# Patient Record
Sex: Female | Born: 1995
Health system: Southern US, Community
[De-identification: ages and names within clinical notes are randomized; demographics above are authoritative.]

## PROBLEM LIST (undated history)

## (undated) DIAGNOSIS — R42 Dizziness and giddiness: Secondary | ICD-10-CM

## (undated) DIAGNOSIS — J329 Chronic sinusitis, unspecified: Secondary | ICD-10-CM

## (undated) DIAGNOSIS — R51 Headache: Secondary | ICD-10-CM

## (undated) DIAGNOSIS — K5900 Constipation, unspecified: Secondary | ICD-10-CM

## (undated) DIAGNOSIS — R1011 Right upper quadrant pain: Secondary | ICD-10-CM

## (undated) DIAGNOSIS — Z00129 Encounter for routine child health examination without abnormal findings: Secondary | ICD-10-CM

## (undated) DIAGNOSIS — K9041 Non-celiac gluten sensitivity: Secondary | ICD-10-CM

## (undated) DIAGNOSIS — K219 Gastro-esophageal reflux disease without esophagitis: Secondary | ICD-10-CM

## (undated) DIAGNOSIS — R109 Unspecified abdominal pain: Secondary | ICD-10-CM

## (undated) DIAGNOSIS — F909 Attention-deficit hyperactivity disorder, unspecified type: Secondary | ICD-10-CM

## (undated) DIAGNOSIS — F419 Anxiety disorder, unspecified: Secondary | ICD-10-CM

## (undated) DIAGNOSIS — E663 Overweight: Secondary | ICD-10-CM

## (undated) DIAGNOSIS — T7840XA Allergy, unspecified, initial encounter: Secondary | ICD-10-CM

## (undated) DIAGNOSIS — Z0000 Encounter for general adult medical examination without abnormal findings: Secondary | ICD-10-CM

## (undated) DIAGNOSIS — F329 Major depressive disorder, single episode, unspecified: Secondary | ICD-10-CM

## (undated) DIAGNOSIS — IMO0001 Reserved for inherently not codable concepts without codable children: Secondary | ICD-10-CM

## (undated) HISTORY — DX: Right upper quadrant pain: R10.11

## (undated) HISTORY — DX: Non-celiac gluten sensitivity: K90.41

## (undated) HISTORY — DX: Anxiety disorder, unspecified: F41.9

## (undated) HISTORY — DX: Encounter for general adult medical examination without abnormal findings: Z00.00

## (undated) HISTORY — DX: Gastro-esophageal reflux disease without esophagitis: K21.9

## (undated) HISTORY — DX: Unspecified abdominal pain: R10.9

## (undated) HISTORY — DX: Overweight: E66.3

## (undated) HISTORY — DX: Major depressive disorder, single episode, unspecified: F32.9

## (undated) HISTORY — PX: ADENOIDECTOMY: SUR15

## (undated) HISTORY — DX: Reserved for inherently not codable concepts without codable children: IMO0001

## (undated) HISTORY — DX: Encounter for routine child health examination without abnormal findings: Z00.129

## (undated) HISTORY — PX: SINUS EXPLORATION: SHX5214

## (undated) HISTORY — DX: Chronic sinusitis, unspecified: J32.9

## (undated) HISTORY — DX: Dizziness and giddiness: R42

## (undated) HISTORY — DX: Constipation, unspecified: K59.00

## (undated) HISTORY — PX: TONSILLECTOMY: SUR1361

---
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
## 2016-12-08 ENCOUNTER — Other Ambulatory Visit (INDEPENDENT_AMBULATORY_CARE_PROVIDER_SITE_OTHER): Payer: 59

## 2016-12-08 ENCOUNTER — Encounter: Payer: Self-pay | Admitting: Family Medicine

## 2016-12-08 ENCOUNTER — Ambulatory Visit (INDEPENDENT_AMBULATORY_CARE_PROVIDER_SITE_OTHER): Payer: 59 | Admitting: Family Medicine

## 2016-12-08 VITALS — BP 118/72 | HR 75 | Temp 98.4°F | Resp 18 | Ht 66.0 in | Wt 190.0 lb

## 2016-12-08 DIAGNOSIS — N649 Disorder of breast, unspecified: Secondary | ICD-10-CM

## 2016-12-08 DIAGNOSIS — F329 Major depressive disorder, single episode, unspecified: Secondary | ICD-10-CM | POA: Diagnosis not present

## 2016-12-08 DIAGNOSIS — R002 Palpitations: Secondary | ICD-10-CM | POA: Diagnosis not present

## 2016-12-08 DIAGNOSIS — R1011 Right upper quadrant pain: Secondary | ICD-10-CM

## 2016-12-08 DIAGNOSIS — E663 Overweight: Secondary | ICD-10-CM

## 2016-12-08 DIAGNOSIS — R42 Dizziness and giddiness: Secondary | ICD-10-CM

## 2016-12-08 DIAGNOSIS — F419 Anxiety disorder, unspecified: Secondary | ICD-10-CM | POA: Diagnosis not present

## 2016-12-08 DIAGNOSIS — Z0000 Encounter for general adult medical examination without abnormal findings: Secondary | ICD-10-CM

## 2016-12-08 DIAGNOSIS — Z0001 Encounter for general adult medical examination with abnormal findings: Secondary | ICD-10-CM | POA: Diagnosis not present

## 2016-12-08 DIAGNOSIS — F32A Depression, unspecified: Secondary | ICD-10-CM

## 2016-12-08 HISTORY — DX: Right upper quadrant pain: R10.11

## 2016-12-08 HISTORY — DX: Dizziness and giddiness: R42

## 2016-12-08 LAB — CBC WITH DIFFERENTIAL/PLATELET
Basophils Absolute: 0.1 10*3/uL (ref 0.0–0.1)
Basophils Relative: 0.9 % (ref 0.0–3.0)
Eosinophils Absolute: 0.3 10*3/uL (ref 0.0–0.7)
Eosinophils Relative: 4 % (ref 0.0–5.0)
HCT: 40 % (ref 36.0–46.0)
Hemoglobin: 13.4 g/dL (ref 12.0–15.0)
Lymphocytes Relative: 39.2 % (ref 12.0–46.0)
Lymphs Abs: 2.9 10*3/uL (ref 0.7–4.0)
MCHC: 33.4 g/dL (ref 30.0–36.0)
MCV: 84.3 fl (ref 78.0–100.0)
MONO ABS: 0.6 10*3/uL (ref 0.1–1.0)
Monocytes Relative: 7.5 % (ref 3.0–12.0)
Neutro Abs: 3.6 10*3/uL (ref 1.4–7.7)
Neutrophils Relative %: 48.4 % (ref 43.0–77.0)
Platelets: 233 10*3/uL (ref 150.0–400.0)
RBC: 4.75 Mil/uL (ref 3.87–5.11)
RDW: 13.9 % (ref 11.5–14.6)
WBC: 7.5 10*3/uL (ref 4.5–10.5)

## 2016-12-08 LAB — COMPREHENSIVE METABOLIC PANEL
ALT: 36 U/L — AB (ref 0–35)
AST: 26 U/L (ref 0–37)
Albumin: 4.8 g/dL (ref 3.5–5.2)
Alkaline Phosphatase: 70 U/L (ref 39–117)
BILIRUBIN TOTAL: 0.4 mg/dL (ref 0.2–1.2)
BUN: 15 mg/dL (ref 6–23)
CHLORIDE: 104 meq/L (ref 96–112)
CO2: 26 mEq/L (ref 19–32)
Calcium: 10 mg/dL (ref 8.4–10.5)
Creatinine, Ser: 0.85 mg/dL (ref 0.40–1.20)
GFR: 90.14 mL/min (ref >60.00)
GLUCOSE: 71 mg/dL (ref 70–99)
POTASSIUM: 3.7 meq/L (ref 3.5–5.1)
Sodium: 139 mEq/L (ref 135–145)
Total Protein: 7.8 g/dL (ref 6.0–8.3)

## 2016-12-08 LAB — TSH: TSH: 1.78 u[IU]/mL (ref 0.35–5.50)

## 2016-12-08 LAB — SEDIMENTATION RATE: SED RATE: 7 mm/h (ref 0–20)

## 2016-12-08 NOTE — Assessment & Plan Note (Signed)
Patient encouraged to maintain heart healthy diet, regular exercise, adequate sleep. Consider daily probiotics. Take medications as prescribed 

## 2016-12-08 NOTE — Progress Notes (Signed)
Pre visit review using our clinic review tool, if applicable. No additional management support is needed unless otherwise documented below in the visit note. 

## 2016-12-08 NOTE — Patient Instructions (Addendum)
64 oz of clear fluids, gatorade,small frequent meals with protein Postural Orthostatic Tachycardia Syndrome Postural orthostatic tachycardia syndrome (POTS) is a group of symptoms that can occur when you stand up after lying down. POTS happens when less blood flows to the heart than normal after you stand up. The reduced blood flow to the heart makes the heart beat rapidly. POTS may be associated with another medical condition, or it may occur on its own. What are the causes? This cause of this condition is not known, but many conditions and diseases have been linked to it. What increases the risk? This condition is more likely to develop in:  Women 13-66 years old.  Women who are pregnant.  Women who are menstruating.  People who have certain conditions, including:  A viral infection.  An autoimmune disease.  Anemia.  Dehydration.  Hyperthyroidism.  People who take certain medicines.  People who have had a major injury.  People who have had surgery. What are the signs or symptoms? The most common symptom of this condition is lightheadedness after standing up from a lying down position. Other symptoms may include:  Feeling a rapid increase in the speed of the heartbeat (tachycardia) within 10 minutes of standing up.  Fainting.  Weakness.  Confusion.  Trembling.  Shortness of breath.  Sweating or flushing.  Headache.  Chest pain.  Breathing that is deeper and faster than normal (hyperventilation).  Nausea.  Anxiety. Symptoms may be worse in the morning, and they may be relieved by lying down. How is this diagnosed? This condition is diagnosed based on:  Your symptoms.  Your medical history.  A physical exam.  Measurements of your heart rate when you are lying down and after you stand up.  A measurement of your blood pressure. The measurement will be taken when you go from lying down to standing up.  Blood tests to measure hormones that change  with blood pressure. The blood tests will be done when you are lying down and standing up. You may have other tests to check whether you have a condition or disease that is linked to POTS. How is this treated? Treatment for this condition depends on how severe your symptoms are and whether you have any conditions or diseases that have been linked to POTS. Treatment may involve:  Treating any conditions or diseases that have been linked to POTS.  Drinking two glasses of water before getting up from a lying position.  Eating more salt (sodium).  Taking medicine to control blood pressure and heart rate (beta-blocker).  Taking medicines to control blood flow, blood pressure, or heart rate.  Avoiding certain medicines.  Starting an exercise program under the supervision of your health care provider. Follow these instructions at home:   Eating and drinking   Drink enough fluid to keep your urine clear or pale yellow.  If told by your health care provider, drink two glasses of water before getting up from a lying position.  Follow instructions from your health care provider about how much sodium you should eat.  Eat several small meals a day instead of a few large meals.  Avoid heavy meals. Medicines   Take over-the-counter and prescription medicines only as told by your health care provider.  Talk with your health care provider before starting any new medicines. Activity   Do an aerobic exercise for 20 minutes a day, at least 3 days a week.  Ask your health care provider what kinds of exercise are safe for  you. Contact a health care provider if:  Your symptoms do not improve after treatment.  Your symptoms get worse.  You develop new symptoms. Get help right away if:  You have chest pain. You have difficulty breathing. Preventive Care 18-39 Years, Female Preventive care refers to lifestyle choices and visits with your health care provider that can promote health and  wellness. What does preventive care include?  A yearly physical exam. This is also called an annual well check.  Dental exams once or twice a year.  Routine eye exams. Ask your health care provider how often you should have your eyes checked.  Personal lifestyle choices, including:  Daily care of your teeth and gums.  Regular physical activity.  Eating a healthy diet.  Avoiding tobacco and drug use.  Limiting alcohol use.  Practicing safe sex.  Taking vitamin and mineral supplements as recommended by your health care provider. What happens during an annual well check? The services and screenings done by your health care provider during your annual well check will depend on your age, overall health, lifestyle risk factors, and family history of disease. Counseling  Your health care provider may ask you questions about your:  Alcohol use.  Tobacco use.  Drug use.  Emotional well-being.  Home and relationship well-being.  Sexual activity.  Eating habits.  Work and work Statistician.  Method of birth control.  Menstrual cycle.  Pregnancy history. Screening  You may have the following tests or measurements:  Height, weight, and BMI.  Diabetes screening. This is done by checking your blood sugar (glucose) after you have not eaten for a while (fasting).  Blood pressure.  Lipid and cholesterol levels. These may be checked every 5 years starting at age 72.  Skin check.  Hepatitis C blood test.  Hepatitis B blood test.  Sexually transmitted disease (STD) testing.  BRCA-related cancer screening. This may be done if you have a family history of breast, ovarian, tubal, or peritoneal cancers.  Pelvic exam and Pap test. This may be done every 3 years starting at age 62. Starting at age 56, this may be done every 5 years if you have a Pap test in combination with an HPV test. Discuss your test results, treatment options, and if necessary, the need for more tests  with your health care provider. Vaccines  Your health care provider may recommend certain vaccines, such as:  Influenza vaccine. This is recommended every year.  Tetanus, diphtheria, and acellular pertussis (Tdap, Td) vaccine. You may need a Td booster every 10 years.  Varicella vaccine. You may need this if you have not been vaccinated.  HPV vaccine. If you are 52 or younger, you may need three doses over 6 months.  Measles, mumps, and rubella (MMR) vaccine. You may need at least one dose of MMR. You may also need a second dose.  Pneumococcal 13-valent conjugate (PCV13) vaccine. You may need this if you have certain conditions and were not previously vaccinated.  Pneumococcal polysaccharide (PPSV23) vaccine. You may need one or two doses if you smoke cigarettes or if you have certain conditions.  Meningococcal vaccine. One dose is recommended if you are age 51-21 years and a first-year college student living in a residence hall, or if you have one of several medical conditions. You may also need additional booster doses.  Hepatitis A vaccine. You may need this if you have certain conditions or if you travel or work in places where you may be exposed to hepatitis A.  Hepatitis B vaccine. You may need this if you have certain conditions or if you travel or work in places where you may be exposed to hepatitis B.  Haemophilus influenzae type b (Hib) vaccine. You may need this if you have certain risk factors. Talk to your health care provider about which screenings and vaccines you need and how often you need them. This information is not intended to replace advice given to you by your health care provider. Make sure you discuss any questions you have with your health care provider. Document Released: 09/19/2001 Document Revised: 04/12/2016 Document Reviewed: 05/25/2015 Elsevier Interactive Patient Education  2017 [place] have fainting episodes. This information is not  intended to replace advice given to you by your health care provider. Make sure you discuss any questions you have with your health care provider. Document Released: 07/14/2002 Document Revised: 09/01/2015 Document Reviewed: 02/05/2015 Elsevier Interactive Patient Education  2017 [employer].

## 2016-12-08 NOTE — Assessment & Plan Note (Signed)
Notes increased frequency of anxiety attacks and finds she is worrying about her health more. Referred to behavioral health

## 2016-12-09 ENCOUNTER — Ambulatory Visit (HOSPITAL_BASED_OUTPATIENT_CLINIC_OR_DEPARTMENT_OTHER)
Admission: RE | Admit: 2016-12-09 | Discharge: 2016-12-09 | Disposition: A | Discharge status: Home / Self Care | Payer: 59 | Source: Ambulatory Visit | Attending: Family Medicine | Admitting: Family Medicine

## 2016-12-09 DIAGNOSIS — R1011 Right upper quadrant pain: Secondary | ICD-10-CM | POA: Insufficient documentation

## 2016-12-10 DIAGNOSIS — N649 Disorder of breast, unspecified: Secondary | ICD-10-CM

## 2016-12-10 DIAGNOSIS — R002 Palpitations: Secondary | ICD-10-CM | POA: Insufficient documentation

## 2016-12-10 HISTORY — DX: Disorder of breast, unspecified: N64.9

## 2016-12-10 NOTE — Assessment & Plan Note (Signed)
Ultrasound unremarkable, minimize offending and fatty foods, will need referral to GI if symptoms worsen

## 2016-12-10 NOTE — Assessment & Plan Note (Signed)
Consider DASH diet and regular exercise

## 2016-12-10 NOTE — Assessment & Plan Note (Signed)
Mild symptoms recently none now. Encouraged adequate hydration

## 2016-12-11 NOTE — Assessment & Plan Note (Signed)
Likely fibrocystic but will proceed with mgm

## 2016-12-11 NOTE — Assessment & Plan Note (Signed)
Usually related to anxiety. Referred to behavioral health, report worsening symptoms.

## 2016-12-11 NOTE — Progress Notes (Signed)
Patient ID: Cynthia Wolfe, female   DOB: 12-22-1995, 21 y.o.   MRN: [id number]   Subjective:    Patient ID: Cynthia Wolfe, female    DOB: 27-Nov-1995, 21 y.o.   MRN: [id number]  Chief Complaint  Patient presents with  . Establish Care    HPI Patient is in today for annual exam with numerous concerns. She endorses high levels of anxiety and stress. She endorses episodes of sob with associated palpitations, tremulousness, diffcuty concentrating. She denies any recent febrile illness or hospitalizations. Is noting some episodes of feeling light headedness and even some sense of the room spinning for a few seconds. No headache or vomiting. No falls or trauma. Denies CP/palp/SOB/HA/congestion/fevers/GI or GU c/o. Taking meds as prescribed  Past Medical History:  Diagnosis Date  . Abdominal pain   . ADHD (attention deficit hyperactivity disorder)    Hx: of ADD  . Allergy    Hx: of seasonal  . Anxiety and depression 05/17/2013  . Constipation   . Gluten intolerance   . Headache(784.0)   . Overweight 05/17/2013  . Preventative health care 05/17/2013  . Reflux   . RUQ pain 12/08/2016  . Unspecified sinusitis (chronic) 05/17/2013  . Vertigo 12/08/2016  . [employer] (well child check) 05/17/2013    Past Surgical History:  Procedure Laterality Date  . ADENOIDECTOMY    . ESOPHAGOGASTRODUODENOSCOPY N/A 12/27/2012   Procedure: ESOPHAGOGASTRODUODENOSCOPY (EGD);  Surgeon: Oletha Blend, MD;  Location: [facility];  Service: Gastroenterology;  Laterality: N/A;  . SINUS EXPLORATION     Hx: of  . TONSILLECTOMY      Family History  Problem Relation Age of Onset  . Cholelithiasis Mother   . Cancer Mother 46    non- hodgin lymphoma  . Cholelithiasis Maternal Grandmother   . Cancer Maternal Grandmother     liver and gall bladder  . Depression Maternal Grandmother   . Diabetes Maternal Grandmother   . Hypertension Father   . Diabetes Father     type 2  . Heart disease Father   . Depression Father   .  Cancer Maternal Grandfather     lung  . Diabetes Paternal Grandmother     type 2  . Fibromyalgia Paternal Grandmother   . Hyperlipidemia Paternal Grandmother   . Depression Paternal Grandmother     Social History   Social History  . Marital status: Single    Spouse name: N/A  . Number of children: N/A  . Years of education: N/A   Occupational History  . Not on file.   Social History Main Topics  . Smoking status: Never Smoker  . Smokeless tobacco: Never Used  . Alcohol use No  . Drug use: Yes    Types: Marijuana  . Sexual activity: Not Currently    Partners: Male   Other Topics Concern  . Not on file   Social History Narrative   11th grade    Outpatient Medications Prior to Visit  Medication Sig Dispense Refill  . acetaminophen (TYLENOL) 500 MG tablet Take 1,000 mg by mouth every 6 (six) hours as needed for mild pain.    . cephALEXin (KEFLEX) 500 MG capsule Take 1 capsule (500 mg total) by mouth 4 (four) times daily. 40 capsule 0  . HYDROcodone-acetaminophen (NORCO/VICODIN) 5-325 MG per tablet Take 1-2 tablets by mouth every 6 (six) hours as needed for moderate pain or severe pain. 10 tablet 0  . naproxen sodium (ANAPROX) 220 MG tablet Take 440 mg by mouth 2 (two)  times daily as needed (pain).    . ondansetron (ZOFRAN) 4 MG tablet Take 1 tablet (4 mg total) by mouth every 6 (six) hours. 12 tablet 0   No facility-administered medications prior to visit.     Allergies  Allergen Reactions  . Gluten Meal     Constipation/pain    Review of Systems  Constitutional: Negative for fever and malaise/fatigue.  HENT: Negative for congestion.   Eyes: Negative for blurred vision.  Respiratory: Negative for shortness of breath.   Cardiovascular: Positive for palpitations. Negative for chest pain and leg swelling.  Gastrointestinal: Negative for abdominal pain, blood in stool and nausea.  Genitourinary: Negative for dysuria and frequency.  Musculoskeletal: Negative for  falls.  Skin: Negative for rash.  Neurological: Negative for dizziness, loss of consciousness and headaches.  Endo/Heme/Allergies: Negative for environmental allergies.  Psychiatric/Behavioral: Negative for depression. The patient is nervous/anxious.        Objective:    Physical Exam  Constitutional: She is oriented to person, place, and time. She appears well-developed and well-nourished. No distress.  HENT:  Head: Normocephalic and atraumatic.  Eyes: Conjunctivae are normal.  Neck: Neck supple. No thyromegaly present.  Cardiovascular: Normal rate, regular rhythm and normal heart sounds.   No murmur heard. Pulmonary/Chest: Effort normal and breath sounds normal. No respiratory distress.  Abdominal: Soft. Bowel sounds are normal. She exhibits no distension and no mass. There is no tenderness.  Genitourinary:  Genitourinary Comments: Left breast exam unremarkable, right breast 1 cm firm, tender mobile lesion at 9 oclock  Musculoskeletal: She exhibits no edema.  Lymphadenopathy:    She has no cervical adenopathy.  Neurological: She is alert and oriented to person, place, and time.  Skin: Skin is warm and dry.  Psychiatric: She has a normal mood and affect. Her behavior is normal.    BP 118/72 (BP Location: Left Arm, Patient Position: Sitting, Cuff Size: Normal)   Pulse 75   Temp 98.4 F (36.9 C) (Oral)   Resp 18   Ht _0  (1.676 m)   Wt 190 lb (86.2 kg)   LMP 11/22/2016 (Approximate)   SpO2 98%   BMI 30.67 kg/m  Wt Readings from Last 3 Encounters:  12/08/16 190 lb (86.2 kg)  05/13/14 165 lb (74.8 kg) (92 %, Z= 1.39)*  07/29/13 162 lb (73.5 kg) (91 %, Z= 1.37)*   * Growth percentiles are based on CDC 2-20 Years data.     Lab Results  Component Value Date   WBC 7.5 12/08/2016   HGB 13.4 12/08/2016   HCT 40.0 12/08/2016   PLT 233.0 12/08/2016   GLUCOSE 71 12/08/2016   ALT 36 (H) 12/08/2016   AST 26 12/08/2016   NA 139 12/08/2016   K 3.7 12/08/2016   CL 104  12/08/2016   CREATININE 0.85 12/08/2016   BUN 15 12/08/2016   CO2 26 12/08/2016   TSH 1.78 12/08/2016    Lab Results  Component Value Date   TSH 1.78 12/08/2016   Lab Results  Component Value Date   WBC 7.5 12/08/2016   HGB 13.4 12/08/2016   HCT 40.0 12/08/2016   MCV 84.3 12/08/2016   PLT 233.0 12/08/2016   Lab Results  Component Value Date   NA 139 12/08/2016   K 3.7 12/08/2016   CO2 26 12/08/2016   GLUCOSE 71 12/08/2016   BUN 15 12/08/2016   CREATININE 0.85 12/08/2016   BILITOT 0.4 12/08/2016   ALKPHOS 70 12/08/2016   AST 26 12/08/2016  ALT 36 (H) 12/08/2016   PROT 7.8 12/08/2016   ALBUMIN 4.8 12/08/2016   CALCIUM 10.0 12/08/2016   ANIONGAP 12 05/13/2014   GFR 90.14 12/08/2016   No results found for: CHOL No results found for: HDL No results found for: LDLCALC No results found for: TRIG No results found for: CHOLHDL No results found for: HGBA1C     Assessment & Plan:   Problem List Items Addressed This Visit    Anxiety and depression    Notes increased frequency of anxiety attacks and finds she is worrying about her health more. Referred to behavioral health      Preventative health care    Patient encouraged to maintain heart healthy diet, regular exercise, adequate sleep. Consider daily probiotics. Take medications as prescribed      Overweight    Consider DASH diet and regular exercise      RUQ pain    Ultrasound unremarkable, minimize offending and fatty foods, will need referral to GI if symptoms worsen      Relevant Orders   US Abdomen Complete (Completed)   Comprehensive metabolic panel   CBC with Differential/Platelet (Completed)   Sed Rate (ESR) (Completed)   Vertigo    Mild symptoms recently none now. Encouraged adequate hydration      Lesion of breast    Likely fibrocystic but will proceed with mgm      Relevant Orders   US BREAST COMPLETE UNI RIGHT INC AXILLA   Palpitation - Primary    Usually related to anxiety. Referred  to behavioral health, report worsening symptoms.       Relevant Orders   TSH (Completed)      I have discontinued Ms. Leap's acetaminophen, naproxen sodium, cephALEXin, HYDROcodone-acetaminophen, and ondansetron.  No orders of the defined types were placed in this encounter.    Penni Homans, MD

## 2017-01-03 ENCOUNTER — Ambulatory Visit
Admission: RE | Admit: 2017-01-03 | Discharge: 2017-01-03 | Disposition: A | Discharge status: Home / Self Care | Payer: 59 | Source: Ambulatory Visit | Attending: Family Medicine | Admitting: Family Medicine

## 2017-01-03 DIAGNOSIS — N649 Disorder of breast, unspecified: Secondary | ICD-10-CM

## 2017-03-12 ENCOUNTER — Ambulatory Visit: Payer: 59 | Admitting: Family Medicine

## 2017-03-12 DIAGNOSIS — Z0289 Encounter for other administrative examinations: Secondary | ICD-10-CM

## 2018-12-19 ENCOUNTER — Ambulatory Visit: Payer: Self-pay | Admitting: *Deleted
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
## 2019-01-19 IMAGING — US US ABDOMEN COMPLETE
1 series · 14 of 25 positions shown · non-contrast
Comparison: CT scan of May 14, 2014.

CLINICAL DATA: Right upper quadrant abdominal pain.

EXAM:
ABDOMEN ULTRASOUND COMPLETE

[Series 1: us abdomen complete · 0.21mm/px · 14 of 82 slices shown]
[im 1/82]
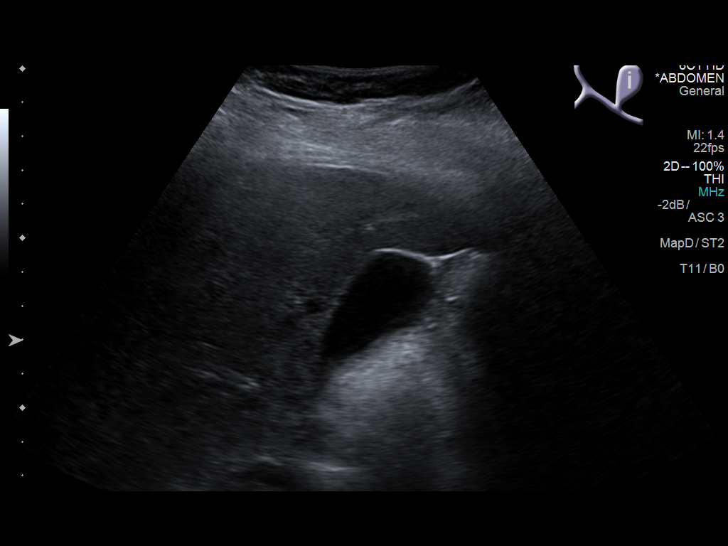
[im 7/82]
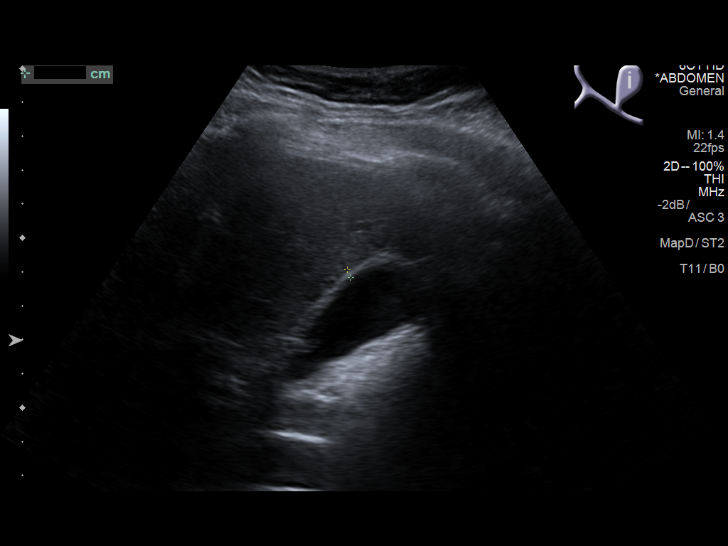
[im 14/82]
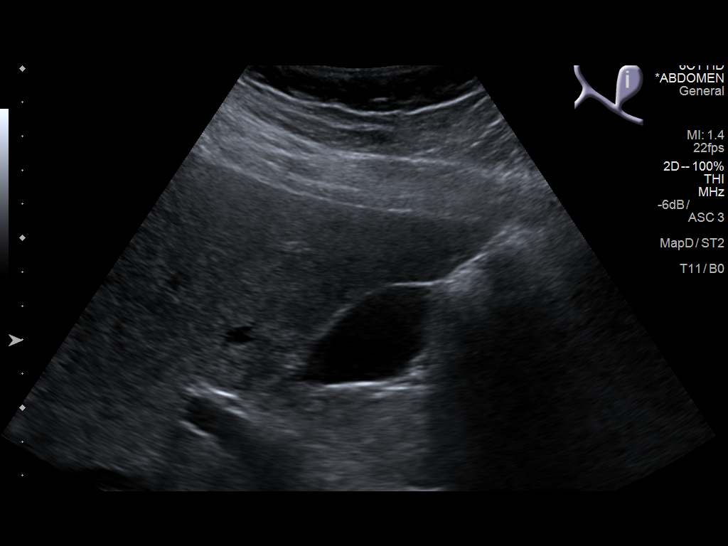
[im 21/82]
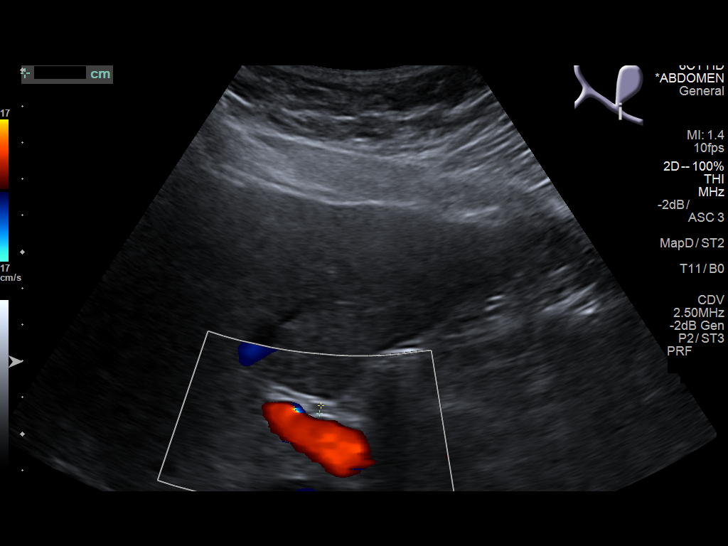
[im 28/82]
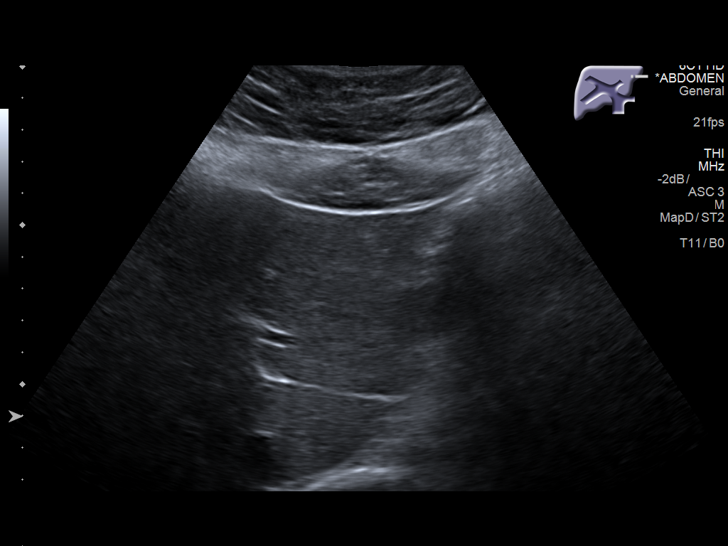
[im 31/82]
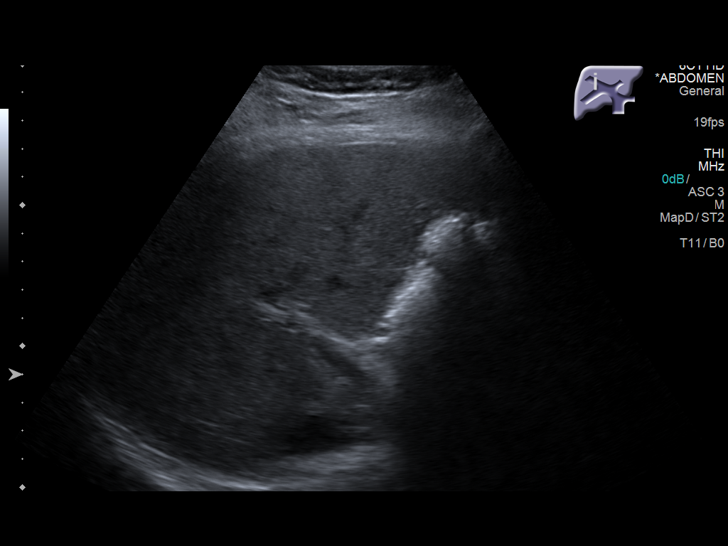
[im 38/82]
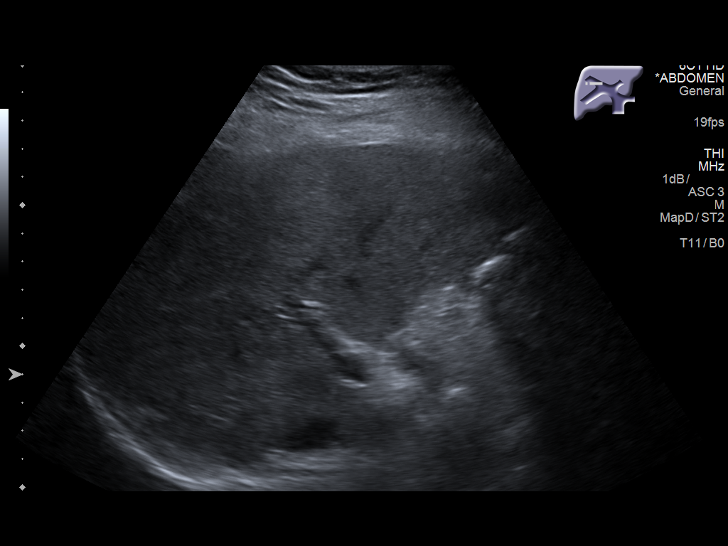
[im 44/82]
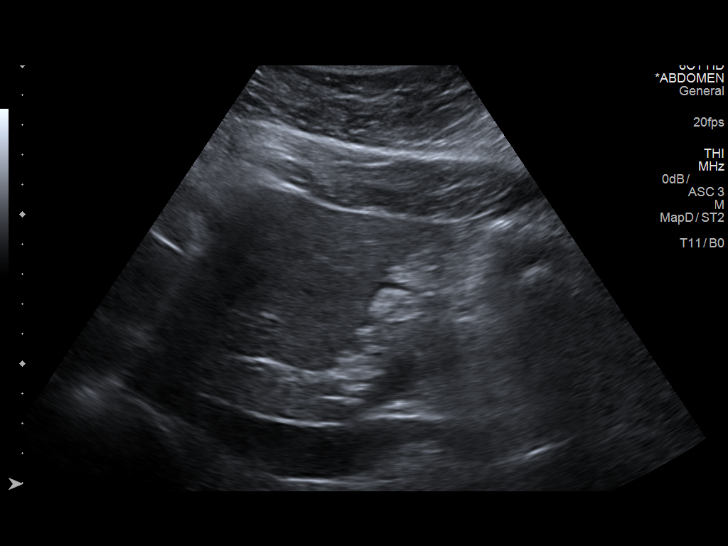
[im 51/82]
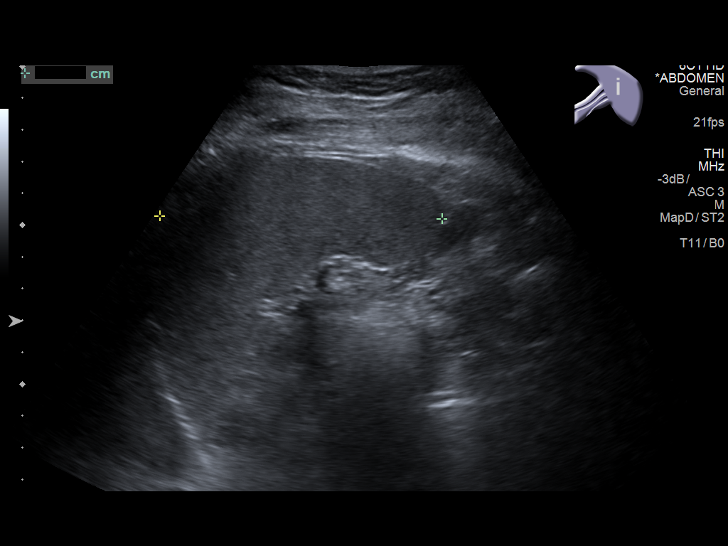
[im 55/82]
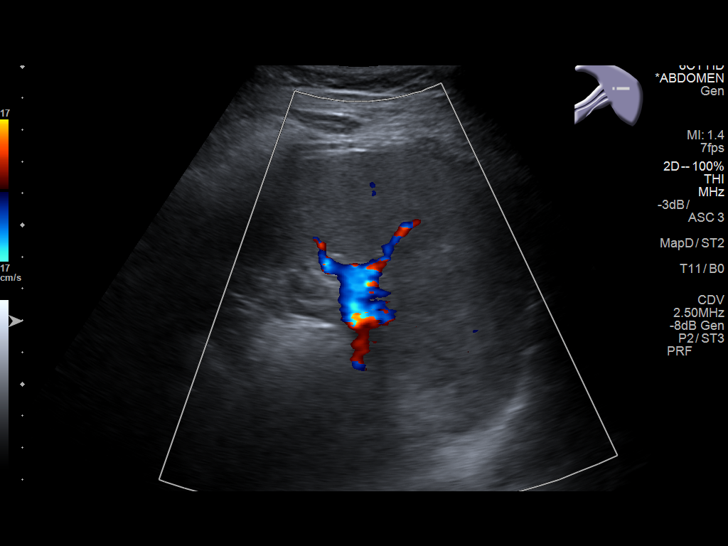
[im 61/82]
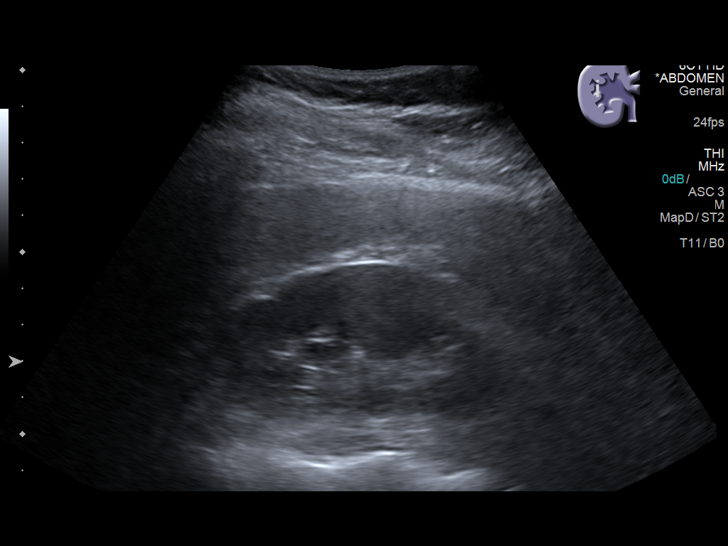
[im 68/82]
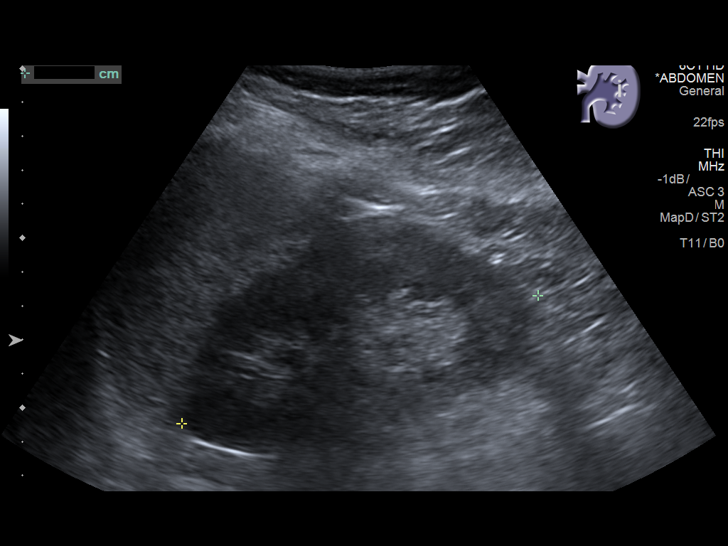
[im 75/82]
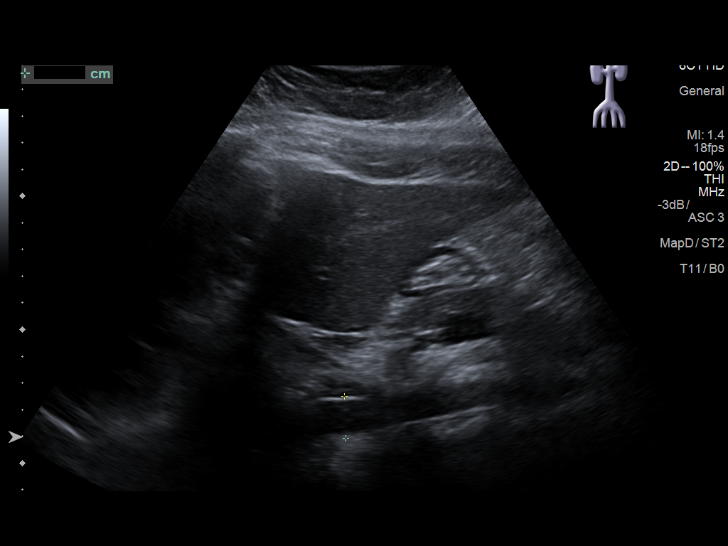
[im 82/82]
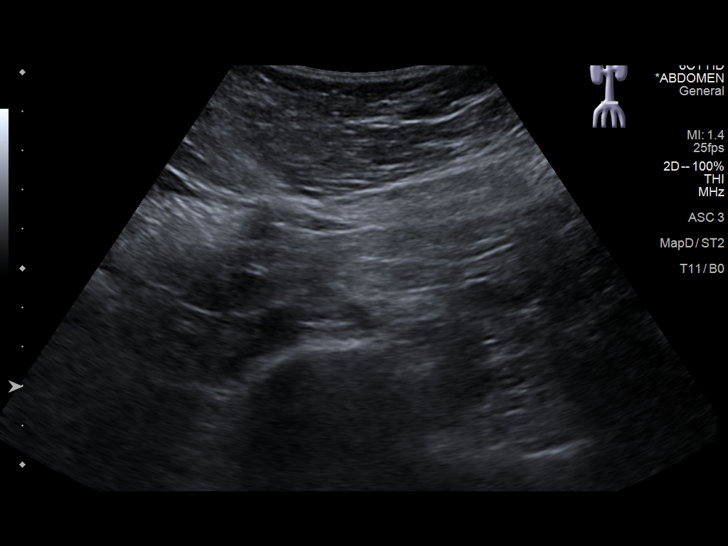

[14 of 25 positions shown; findings below may reference images not displayed]

FINDINGS: Gallbladder: No gallstones or wall thickening visualized. No
sonographic Murphy sign noted by sonographer.

Common bile duct: Diameter: 2.2 mm which is within normal limits.

Liver: No focal lesion identified. Within normal limits in
parenchymal echogenicity.

IVC: No abnormality visualized.

Pancreas: Visualized portion unremarkable.

Spleen: Size and appearance within normal limits.

Right Kidney: Length: 10.2 cm. Echogenicity within normal limits. No
mass or hydronephrosis visualized.

Left Kidney: Length: 11.2 cm. Echogenicity within normal limits. No
mass or hydronephrosis visualized.

Abdominal aorta: No aneurysm visualized.

Other findings: None.
IMPRESSION: No abnormality seen in the abdomen.

## 2019-11-22 ENCOUNTER — Ambulatory Visit: Payer: 59 | Attending: Internal Medicine

## 2019-11-22 DIAGNOSIS — Z23 Encounter for immunization: Secondary | ICD-10-CM

## 2019-11-22 NOTE — Progress Notes (Signed)
   Covid-19 Vaccination Clinic  Name:  Cynthia Wolfe    MRN: [id number] DOB: April 21, 1996  11/22/2019  Cynthia Wolfe was observed post Covid-19 immunization for 15 minutes without incident. She was provided with Vaccine Information Sheet and instruction to access the V-Safe system.   Cynthia Wolfe was instructed to call 911 with any severe reactions post vaccine: Marland Kitchen Difficulty breathing  . Swelling of face and throat  . A fast heartbeat  . A bad rash all over body  . Dizziness and weakness   Immunizations Administered    Name Date Dose VIS Date Route   Pfizer COVID-19 Vaccine 11/22/2019  2:01 PM 0.3 mL 07/18/2019 Intramuscular   Manufacturer: [employer], [employer]   Lot: [id number]   NDC: [address]-1

## 2019-12-15 ENCOUNTER — Ambulatory Visit: Payer: 59 | Attending: Internal Medicine

## 2019-12-15 DIAGNOSIS — Z23 Encounter for immunization: Secondary | ICD-10-CM

## 2019-12-15 NOTE — Progress Notes (Signed)
   Covid-19 Vaccination Clinic  Name:  Cynthia Wolfe    MRN: [id number] DOB: [birth year]  12/15/2019  [name] was observed post Covid-19 immunization for 15 minutes without incident. She was provided with Vaccine Information Sheet and instruction to access the V-Safe system.   [name] was instructed to call 911 with any severe reactions post vaccine: Marland Kitchen Difficulty breathing  . Swelling of face and throat  . A fast heartbeat  . A bad rash all over body  . Dizziness and weakness   Immunizations Administered    Name Date Dose VIS Date Route   Pfizer COVID-19 Vaccine 12/15/2019 10:26 AM 0.3 mL 10/01/2018 Intramuscular   Manufacturer: [employer], [employer]   Lot: [id number]   NDC: [address]-7

## 2020-07-15 ENCOUNTER — Encounter: Payer: Self-pay | Admitting: Family Medicine

## 2020-07-15 ENCOUNTER — Ambulatory Visit: Payer: 59 | Admitting: Family Medicine

## 2020-07-15 ENCOUNTER — Other Ambulatory Visit: Payer: Self-pay

## 2020-07-15 VITALS — BP 126/70 | Ht 66.0 in | Wt 228.1 lb

## 2020-07-15 DIAGNOSIS — F1010 Alcohol abuse, uncomplicated: Secondary | ICD-10-CM

## 2020-07-15 DIAGNOSIS — Z23 Encounter for immunization: Secondary | ICD-10-CM | POA: Diagnosis not present

## 2020-07-15 DIAGNOSIS — F32A Depression, unspecified: Secondary | ICD-10-CM

## 2020-07-15 DIAGNOSIS — Z1159 Encounter for screening for other viral diseases: Secondary | ICD-10-CM | POA: Diagnosis not present

## 2020-07-15 DIAGNOSIS — F419 Anxiety disorder, unspecified: Secondary | ICD-10-CM

## 2020-07-15 DIAGNOSIS — Z87891 Personal history of nicotine dependence: Secondary | ICD-10-CM

## 2020-07-15 DIAGNOSIS — Z0000 Encounter for general adult medical examination without abnormal findings: Secondary | ICD-10-CM

## 2020-07-15 DIAGNOSIS — Z114 Encounter for screening for human immunodeficiency virus [HIV]: Secondary | ICD-10-CM | POA: Diagnosis not present

## 2020-07-15 NOTE — Assessment & Plan Note (Signed)
PHQ9 score of 8. Patient reports no active SI/HI. Does report prior suicide attempt with Tylenol at age 24. - Discussed benefits of counseling and medications with patient - Continue to discuss further

## 2020-07-15 NOTE — Assessment & Plan Note (Signed)
Patient previously smoked 1/2 PPD, unsure for how long. Quit in 04/2019.

## 2020-07-15 NOTE — Patient Instructions (Addendum)
It was so great meeting you today! Today we discussed the following:  Alcohol use: you are already on the path to do doing better by talking about your problems and actively trying to change what you are doing. I do think that habits are one of the hardest things to break, but the times that you normally would think about drinking go and do something else that is unrelated. If you drink while watching TV, then change rooms and workout or read or do a different activity. I do think looking into AA would be a good option, they are judgment free and have some great resources and help. We are checking labs for you today and will follow-up with you regarding the results.   Vision changes: I definitely want you to see an eye doctor soon and get your eyes checked.         Alcohol Abuse and Dependence Information, Adult Alcohol is a widely available drug. People drink alcohol in different amounts. People who drink alcohol very often and in large amounts often have problems during and after drinking. They may develop what is called an alcohol use disorder. There are two main types of alcohol use disorders:  Alcohol abuse. This is when you use alcohol too much or too often. You may use alcohol to make yourself feel happy or to reduce stress. You may have a hard time setting a limit on the amount you drink.  Alcohol dependence. This is when you use alcohol consistently for a period of time, and your body changes as a result. This can make it hard to stop drinking because you may start to feel sick or feel different when you do not use alcohol. These symptoms are known as withdrawal. How can alcohol abuse and dependence affect me? Alcohol abuse and dependence can have a negative effect on your life. Drinking too much can lead to addiction. You may feel like you need alcohol to function normally. You may drink alcohol before work in the morning, during the day, or as soon as you get home from work in the  evening. These actions can result in:  Poor work performance.  Job loss.  Financial problems.  Car crashes or criminal charges from driving after drinking alcohol.  Problems in your relationships with friends and family.  Losing the trust and respect of coworkers, friends, and family. Drinking heavily over a long period of time can permanently damage your body and brain, and can cause lifelong health issues, such as:  Damage to your liver or pancreas.  Heart problems, high blood pressure, or stroke.  Certain cancers.  Decreased ability to fight infections.  Brain or nerve damage.  Depression.  Early (premature) death. If you are careless or you crave alcohol, it is easy to drink more than your body can handle (overdose). Alcohol overdose is a serious situation that requires hospitalization. It may lead to permanent injuries or death. What can increase my risk?  Having a family history of alcohol abuse.  Having depression or other mental health conditions.  Beginning to drink at an early age.  Binge drinking often.  Experiencing trauma, stress, and an unstable home life during childhood.  Spending time with people who drink often. What actions can I take to prevent or manage alcohol abuse and dependence?  Do not drink alcohol if: ? Your health care provider tells you not to drink. ? You are pregnant, may be pregnant, or are planning to become pregnant.  If you drink  alcohol: ? Limit how much you use to:  0-1 drink a day for women.  0-2 drinks a day for men. ? Be aware of how much alcohol is in your drink. In the [place] one drink equals one 12 oz bottle of beer (355 mL), one 5 oz glass of wine (148 mL), or one 1 oz glass of hard liquor (44 mL).  Stop drinking if you have been drinking too much. This can be very hard to do if you are used to abusing alcohol. If you begin to have withdrawal symptoms, talk with your health care provider or a person that you trust.  These symptoms may include anxiety, shaky hands, headache, nausea, sweating, or not being able to sleep.  Choose to drink nonalcoholic beverages in social gatherings and places where there may be alcohol. Activity  Spend more time on activities that you enjoy that do not involve alcohol, like hobbies or exercise.  Find healthy ways to cope with stress, such as exercise, meditation, or spending time with people you care about. General information  Talk to your family, coworkers, and friends about supporting you in your efforts to stop drinking. If they drink, ask them not to drink around you. Spend more time with people who do not drink alcohol.  If you think that you have an alcohol dependency problem: ? Tell friends or family about your concerns. ? Talk with your health care provider or another health professional about where to get help. ? Work with a Paramedic and a Network engineer. ? Consider joining a support group for people who struggle with alcohol abuse and dependence. Where to find support   Your health care provider.  SMART Recovery: www.smartrecovery.org Therapy and support groups  Local treatment centers or chemical dependency counselors.  Local AA groups in your community: SalaryStart.tn Where to find more information  Centers for Disease Control and Prevention: FootballExhibition.com.br  [employer] on Alcohol Abuse and Alcoholism: BasicStudents.dk  Alcoholics Anonymous (AA): SalaryStart.tn Contact a health care provider if:  You drank more or for longer than you intended on more than one occasion.  You tried to stop drinking or to cut back on how much you drink, but you were not able to.  You often drink to the point of vomiting or passing out.  You want to drink so badly that you cannot think about anything else.  You have problems in your life due to drinking, but you continue to drink.  You keep drinking even though you feel anxious, depressed, or  have experienced memory loss.  You have stopped doing the things you used to enjoy in order to drink.  You have to drink more than you used to in order to get the effect you want.  You experience anxiety, sweating, nausea, shakiness, and trouble sleeping when you try to stop drinking. Get help right away if:  You have thoughts about hurting yourself or others.  You have serious withdrawal symptoms, including: ? Confusion. ? Racing heart. ? High blood pressure. ? Fever. If you ever feel like you may hurt yourself or others, or have thoughts about taking your own life, get help right away. You can go to your nearest emergency department or call:  Your local emergency services (911 in the [place]  A suicide crisis helpline, such as the National Suicide Prevention Lifeline at 2281003243. This is open 24 hours a day. Summary  Alcohol abuse and dependence can have a negative effect on your life. Drinking too much  or too often can lead to addiction.  If you drink alcohol, limit how much you use.  If you are having trouble keeping your drinking under control, find ways to change your behavior. Hobbies, calming activities, exercise, or support groups can help.  If you feel you need help with changing your drinking habits, talk with your health care provider, a good friend, or a therapist, or go to an AA group. This information is not intended to replace advice given to you by your health care provider. Make sure you discuss any questions you have with your health care provider. Document Revised: 11/12/2018 Document Reviewed: 10/01/2018 Elsevier Patient Education  2020 [employer].      Alcohol Withdrawal Syndrome When a person who drinks a lot of alcohol stops drinking, he or she may have unpleasant and serious symptoms. These symptoms are called alcohol withdrawal syndrome. This condition may be mild or severe. It can be life-threatening. It can cause:  Shaking that you cannot  control (tremor).  Sweating.  Headache.  Feeling fearful, upset, grouchy, or depressed.  Trouble sleeping (insomnia).  Nightmares.  Fast or uneven heartbeats (palpitations).  Alcohol cravings.  Feeling sick to your stomach (nausea).  Throwing up (vomiting).  Being bothered by light and sounds.  Confusion.  Trouble thinking clearly.  Not being hungry (loss of appetite).  Big changes in mood (mood swings). If you have all of the following symptoms at the same time, get help right away:  High blood pressure.  Fast heartbeat.  Trouble breathing.  Seizures.  Seeing, hearing, feeling, smelling, or tasting things that are not there (hallucinations). These symptoms are known as delirium tremens (DTs). They must be treated at the hospital right away. Follow these instructions at home:   Take over-the-counter and prescription medicines only as told by your doctor. This includes vitamins.  Do not drink alcohol.  Do not drive until your doctor says that this is safe for you.  Have someone stay with you or be available in case you need help. This should be someone you trust. This person can help you with your symptoms. He or she can also help you to not drink.  Drink enough fluid to keep your pee (urine) pale yellow.  Think about joining a support group or a treatment program to help you stop drinking.  Keep all follow-up visits as told by your doctor. This is important. Contact a doctor if:  Your symptoms get worse.  You cannot eat or drink without throwing up.  You have a hard time not drinking alcohol.  You cannot stop drinking alcohol. Get help right away if:  You have fast or uneven heartbeats.  You have chest pain.  You have trouble breathing.  You have a seizure for the first time.  You see, hear, feel, smell, or taste something that is not there.  You get very confused. Summary  When a person who drinks a lot of alcohol stops drinking, he or  she may have serious symptoms. This is called alcohol withdrawal syndrome.  Delirium tremens (DTs) is a group of life-threatening symptoms. You should get help right away if you have these symptoms.  Think about joining an alcohol support group or a treatment program. This information is not intended to replace advice given to you by your health care provider. Make sure you discuss any questions you have with your health care provider. Document Revised: 07/06/2017 Document Reviewed: 03/30/2017 Elsevier Patient Education  2020 [employer].

## 2020-07-15 NOTE — Assessment & Plan Note (Signed)
Current weekend binge drinker, previous every day drinker.  Patient reports feeling guilty and wanting to change.  Believes this is related to depression. -Counseled on benefits of AA -Gave patient resources to local substance abuse counselors and assistance -CMP to evaluate liver function -Gave patient resources about the effects of alcoholism on your body and signs of withdrawal

## 2020-07-15 NOTE — Progress Notes (Signed)
Subjective:    Patient ID: Cynthia Wolfe, female    DOB: March 12, 1996, 24 y.o.   MRN: [id number]   CC: New Patient  HPI: History of STI Patient unsure if it was gonorrhea or chlamydia, states she and her partner were treated.  She reports no intercourse in over a year.  No longer on birth control, previously on Depo shots but has not had a birth control for the last 2 years.  History of tobacco use Patient previously smoked 1/2 pack/day, but states that she quit when she broke up with her boyfriend.  Unsure for how long but that she quit in 04/2019.  Alcohol abuse Patient reports that she has had a problem with drinking for the last several years.  She states she previously used to drink several drinks daily and knew that it was starting to become a problem.  She states that she transitioned to only allowing herself to drink on the weekends, but it has resulted in her binge drinking over 7 drinks on each day of the weekend, and sometimes she still might drink during the week.  She states that she has felt very guilty about this, and wants help.  She denies that she gets "blackout drunk" or has excessive vomiting.  She states she can "handle my liquor".  Though she does report reflux with drinking liquor.  Abnormal menses: Patient reports that when she was younger she had.  Said lasted about 7 days and were heavier than they are currently.  In the last 1.5 years, patient has noticed that her bleeding amount has decreased and she only has periods about 3 days a week.  Vision changes: Patient reports declining vision in the last half year.  She states that now she has noticed that is harder to read things that are further away.  She states she has not seen an optometrist since she was younger and previously wore glasses.  Hx of depression/anxiety: Patient reports history of anxiety and depression for a long time, which is also common in her family.  She thinks this is the main cause of her  drinking, which has caused her to have weight gain, which is worse than the depression.  She currently has started working out again to help with the weight, which she thinks will help with the depression.  States she is currently not on any medications and not seeing counselor.   PMHx: Past Medical History:  Diagnosis Date  . Abdominal pain   . ADHD (attention deficit hyperactivity disorder)    Hx: of ADD  . Allergy    Hx: of seasonal  . Anxiety and depression 05/17/2013  . Constipation   . Gluten intolerance   . Headache(784.0)   . Lesion of breast 12/10/2016  . Overweight 05/17/2013  . Preventative health care 05/17/2013  . Reflux   . RUQ pain 12/08/2016  . Unspecified sinusitis (chronic) 05/17/2013  . Vertigo 12/08/2016  . WCC (well child check) 05/17/2013     Surgical Hx: Past Surgical History:  Procedure Laterality Date  . ADENOIDECTOMY    . ESOPHAGOGASTRODUODENOSCOPY N/A 12/27/2012   Procedure: ESOPHAGOGASTRODUODENOSCOPY (EGD);  Surgeon: Jon Gills, MD;  Location: [facility] OR;  Service: Gastroenterology;  Laterality: N/A;  . SINUS EXPLORATION     Hx: of  . TONSILLECTOMY       Family Hx: Family History  Problem Relation Age of Onset  . Cholelithiasis Mother   . Cancer Mother 53       non- hodgin  lymphoma  . Cholelithiasis Maternal Grandmother   . Cancer Maternal Grandmother        liver and gall bladder  . Depression Maternal Grandmother   . Diabetes Maternal Grandmother   . Hypertension Father   . Diabetes Father        type 2  . Heart disease Father   . Depression Father   . Cancer Maternal Grandfather        lung  . Diabetes Paternal Grandmother        type 2  . Fibromyalgia Paternal Grandmother   . Hyperlipidemia Paternal Grandmother   . Depression Paternal Grandmother      Social Hx: Current Social History  07/15/2020  Who lives at home: Mother    Who would speak for you about health care matters: Mother   Transportation: Set designer  Important  Relationships & Pets: Mother and Cat (bugs)   Current Stressors: Depression  Work / Education:  Currently in college  Religious / Personal Beliefs:   Alcohol: current binge drinker (see HPI) Tobacco: previous user (see HPI) Illicit drugs: previously used marijuana, denies other drugs and current use   Medications: None   ROS: Woman:  Patient reports no  vision/ hearing changes,anorexia, weight change, fever ,adenopathy, persistant / recurrent hoarseness, swallowing issues, chest pain, edema,persistant / recurrent cough, hemoptysis, dyspnea(rest, exertional, paroxysmal nocturnal), gastrointestinal  bleeding (melena, rectal bleeding), abdominal pain, excessive heart burn, GU symptoms(dysuria, hematuria, pyuria, voiding/incontinence  Issues) syncope, focal weakness, severe memory loss, concerning skin lesions, depression, anxiety, abnormal bruising/bleeding, major joint swelling, breast masses or abnormal vaginal bleeding.     Preventative Screening Pap test (per patient): 2020-normal   Smoking status reviewed.   Review of Systems   Objective:  BP 126/70   Ht 5\' 6"  (1.676 m)   Wt 228 lb 2 oz (103.5 kg)   LMP 06/24/2020   BMI 36.82 kg/m  Vitals and nursing note reviewed  General: well nourished, NAD HEENT: normocephalic, no scleral icterus or conjunctival pallor, no nasal discharge, moist mucous membranes, good dentition without erythema or discharge noted in posterior oropharynx Neck: supple, non-tender, without lymphadenopathy Cardiac: RRR, clear S1 and S2, no murmurs, rubs, or gallops Respiratory: CTAB, no increased WOB Abdomen: soft, nontender, nondistended. Bowel sounds present Extremities: no edema or cyanosis. Warm, well perfused. 2+ radial and PT pulses bilaterally Skin: warm and dry, no rashes noted Neuro: alert and oriented, no focal deficits   Assessment & Plan:    History of tobacco use Patient previously smoked 1/2 PPD, unsure for how long. Quit in  04/2019.  Healthcare maintenance -Pap smear: 2020- normal per patient (need records) -Flu vaccine today  -COVID vaccine completed, reports she will get her booster shot at school. -Counseled patient on birth control  Anxiety and depression PHQ9 score of 8. Patient reports no active SI/HI. Does report prior suicide attempt with Tylenol at age 43. - Discussed benefits of counseling and medications with patient - Continue to discuss further  Alcohol abuse Current weekend binge drinker, previous every day drinker.  Patient reports feeling guilty and wanting to change.  Believes this is related to depression. -Counseled on benefits of AA -Gave patient resources to local substance abuse counselors and assistance -CMP to evaluate liver function -Gave patient resources about the effects of alcoholism on your body and signs of withdrawal    Return in about 4 weeks (around 08/12/2020) for F/u alcohol.   10/10/2020, DO, PGY-1

## 2020-07-15 NOTE — Assessment & Plan Note (Addendum)
-  Pap smear: 2020- normal per patient (need records) -Flu vaccine today  -COVID vaccine completed, reports she will get her booster shot at school. -Counseled patient on birth control

## 2020-07-16 LAB — COMPREHENSIVE METABOLIC PANEL
ALT: 28 IU/L (ref 0–32)
AST: 16 IU/L (ref 0–40)
Albumin/Globulin Ratio: 1.5 (ref 1.2–2.2)
Albumin: 4.5 g/dL (ref 3.9–5.0)
Alkaline Phosphatase: 80 IU/L (ref 44–121)
BUN/Creatinine Ratio: 7 — ABNORMAL LOW (ref 9–23)
BUN: 7 mg/dL (ref 6–20)
Bilirubin Total: 0.6 mg/dL (ref 0.0–1.2)
CO2: 23 mmol/L (ref 20–29)
Calcium: 9.7 mg/dL (ref 8.7–10.2)
Chloride: 99 mmol/L (ref 96–106)
Creatinine, Ser: 0.98 mg/dL (ref 0.57–1.00)
GFR calc Af Amer: 93 mL/min/{1.73_m2} (ref >59)
GFR calc non Af Amer: 81 mL/min/{1.73_m2} (ref >59)
Globulin, Total: 3.1 g/dL (ref 1.5–4.5)
Glucose: 88 mg/dL (ref 65–99)
Potassium: 3.8 mmol/L (ref 3.5–5.2)
Sodium: 136 mmol/L (ref 134–144)
Total Protein: 7.6 g/dL (ref 6.0–8.5)

## 2020-07-16 LAB — CBC WITH DIFFERENTIAL/PLATELET
Basophils Absolute: 0.1 10*3/uL (ref 0.0–0.2)
Basos: 1 %
EOS (ABSOLUTE): 0.2 10*3/uL (ref 0.0–0.4)
Eos: 2 %
Hematocrit: 40.3 % (ref 34.0–46.6)
Hemoglobin: 13.4 g/dL (ref 11.1–15.9)
Immature Grans (Abs): 0 10*3/uL (ref 0.0–0.1)
Immature Granulocytes: 0 %
Lymphocytes Absolute: 3 10*3/uL (ref 0.7–3.1)
Lymphs: 29 %
MCH: 27.9 pg (ref 26.6–33.0)
MCHC: 33.3 g/dL (ref 31.5–35.7)
MCV: 84 fL (ref 79–97)
Monocytes Absolute: 0.7 10*3/uL (ref 0.1–0.9)
Monocytes: 7 %
Neutrophils Absolute: 6.6 10*3/uL (ref 1.4–7.0)
Neutrophils: 61 %
Platelets: 258 10*3/uL (ref 150–450)
RBC: 4.81 x10E6/uL (ref 3.77–5.28)
RDW: 13.1 % (ref 11.7–15.4)
WBC: 10.6 10*3/uL (ref 3.4–10.8)

## 2020-07-16 LAB — HCV INTERPRETATION

## 2020-07-16 LAB — HCV AB W REFLEX TO QUANT PCR: HCV Ab: <0.1 s/co ratio (ref 0.0–0.9)

## 2020-07-16 LAB — HIV ANTIBODY (ROUTINE TESTING W REFLEX): HIV Screen 4th Generation wRfx: NONREACTIVE

## 2020-08-11 ENCOUNTER — Telehealth: Payer: Self-pay | Admitting: Family Medicine

## 2020-08-11 NOTE — Telephone Encounter (Cosign Needed)
Pt's mother walked in stating pt has not received follow-up or lab results. Requesting call back to pt # [phone].

## 2020-08-16 NOTE — Telephone Encounter (Signed)
Was able to speak with patient about results. Discussed prior issue with not being able to leave a voicemail to get back in contact. Patient will schedule an appointment at a later date for a 1 year follow-up.

## 2020-08-16 NOTE — Telephone Encounter (Signed)
I have attempted to call this patient previously and the phone has gone directly to voicemail but states that the voicemail is full. I attempted again at the request of the mother per message and got the same response. I will try again this afternoon.

## 2021-09-19 ENCOUNTER — Other Ambulatory Visit: Payer: Self-pay

## 2021-09-19 ENCOUNTER — Ambulatory Visit (INDEPENDENT_AMBULATORY_CARE_PROVIDER_SITE_OTHER): Payer: 59 | Admitting: Family Medicine

## 2021-09-19 VITALS — BP 129/86 | HR 92 | Ht 66.0 in | Wt 222.8 lb

## 2021-09-19 DIAGNOSIS — R42 Dizziness and giddiness: Secondary | ICD-10-CM | POA: Diagnosis not present

## 2021-09-19 DIAGNOSIS — N23 Unspecified renal colic: Secondary | ICD-10-CM

## 2021-09-19 DIAGNOSIS — Z0000 Encounter for general adult medical examination without abnormal findings: Secondary | ICD-10-CM | POA: Diagnosis not present

## 2021-09-19 LAB — POCT UA - MICROSCOPIC ONLY: Epithelial cells, urine per micros: >20

## 2021-09-19 LAB — POCT URINALYSIS DIP (MANUAL ENTRY)
Bilirubin, UA: NEGATIVE
Glucose, UA: NEGATIVE mg/dL
Ketones, POC UA: NEGATIVE mg/dL
Leukocytes, UA: NEGATIVE
Nitrite, UA: NEGATIVE
Protein Ur, POC: NEGATIVE mg/dL
Spec Grav, UA: 1.02 (ref 1.010–1.025)
Urobilinogen, UA: 0.2 E.U./dL
pH, UA: 5.5 (ref 5.0–8.0)

## 2021-09-19 NOTE — Progress Notes (Signed)
° ° °  SUBJECTIVE:   CHIEF COMPLAINT / HPI:   Patient presents due to a vaginal/flank pain that occurred about 1 month ago.  She reports that at the time, she was going to the restroom and began having a pain that felt like it was in her uterus, the pain traveled up her abdomen into what she felt was a kidney.  After urinating, she went to the living room and sat down and she had pain radiating into her vagina.  The pain resolved quickly and has not recurred since.  She notes that the pain was somewhat stabbing but also had burning with urination, it was similar to when she had a ruptured cyst at age 26.  She denies any fever, current dysuria/frequency/hesitancy, no back pain or abdominal pain at this time   At the end of the visit, after discussion about not obtaining further labs, patient mentioned that she had been having the shaky/dizzy spells possibly in relation to her eating or blood sugars.  She was not sure if she would need labs for this today.  Discussed with her that we did not have time to address that at this visit but did recommend that she keep a diary of the symptoms and follow-up closely.   PERTINENT  PMH / PSH: Reviewed  OBJECTIVE:   BP (!) 148/96    Pulse 92    Ht 5\' 6"  (1.676 m)    Wt 222 lb 12.8 oz (101.1 kg)    LMP 09/16/2021    SpO2 99%    BMI 35.96 kg/m   General: NAD, well-appearing, well-nourished Respiratory: No respiratory distress, breathing comfortably, able to speak in full sentences Skin: warm and dry, no rashes noted on exposed skin Psych: Appropriate affect and mood GI: Soft, nontender, nondistended, no obvious organomegaly   ASSESSMENT/PLAN:   Isolated episode of pelvic/flank pain Episode occurred 1 month ago and has not had any recurrence.  Consideration that it was a small kidney stone vs urethritis vs pelvic pathology.  Reassured that it has not recurred and there have been no lingering symptoms.  Urinalysis showed trace blood (patient near her  menstrual cycle) without concern for UTI.  Do not feel that further evaluation is necessary at this time unless it becomes a recurring symptom. - Return precautions given  Dizzy spells At the end of the visit, patient reported that she had been having some shaky/dizzy spells possibly related to eating or her blood sugars.  Unable to address fully at this visit, discussed with patient to track her symptoms with a diary and to follow-up. - Patient to follow-up in the next 1 to 2 months - Recommended symptom diary and food tracking  Healthcare maintenance Patient had Pap smear in 2020 that was reportedly negative.  Patient due for Pap smear, counseled to come back for Pap smear at next visit.   2021, DO [facility]

## 2021-09-19 NOTE — Patient Instructions (Addendum)
I am not sure exactly what the pain in your kidney was at that time, it is less possible that there was a small stone but that there would not be anything to do about it at this time.  If the episodes become persistent or recur again then I would recommend trying to follow-up a little bit sooner to see if there is anything we need to evaluate such as an ultrasound or further examination.  Your urine had a little bit of blood in it, but nothing that we would need to evaluate at this time. We will consider checking in the future but just keep an eye on your symptoms and make sure to come back to the clinic if it occurs again.

## 2021-10-23 ENCOUNTER — Other Ambulatory Visit: Payer: Self-pay

## 2021-10-23 ENCOUNTER — Emergency Department (HOSPITAL_BASED_OUTPATIENT_CLINIC_OR_DEPARTMENT_OTHER): Payer: 59

## 2021-10-23 ENCOUNTER — Encounter (HOSPITAL_BASED_OUTPATIENT_CLINIC_OR_DEPARTMENT_OTHER): Payer: Self-pay | Admitting: Emergency Medicine

## 2021-10-23 ENCOUNTER — Emergency Department (HOSPITAL_BASED_OUTPATIENT_CLINIC_OR_DEPARTMENT_OTHER)
Admission: EM | Admit: 2021-10-23 | Discharge: 2021-10-23 | Disposition: A | Discharge status: Home / Self Care | Payer: 59 | Attending: Emergency Medicine | Admitting: Emergency Medicine

## 2021-10-23 DIAGNOSIS — X501XXA Overexertion from prolonged static or awkward postures, initial encounter: Secondary | ICD-10-CM | POA: Diagnosis not present

## 2021-10-23 DIAGNOSIS — S82891A Other fracture of right lower leg, initial encounter for closed fracture: Secondary | ICD-10-CM

## 2021-10-23 DIAGNOSIS — Y9374 Activity, frisbee: Secondary | ICD-10-CM | POA: Diagnosis not present

## 2021-10-23 DIAGNOSIS — S99911A Unspecified injury of right ankle, initial encounter: Secondary | ICD-10-CM | POA: Diagnosis present

## 2021-10-23 DIAGNOSIS — S92151A Displaced avulsion fracture (chip fracture) of right talus, initial encounter for closed fracture: Secondary | ICD-10-CM | POA: Insufficient documentation

## 2021-10-23 NOTE — ED Triage Notes (Signed)
Pt c/o pain to RT ankle after turning it and falling on it today; swelling noted ?

## 2021-10-23 NOTE — Discharge Instructions (Signed)
Please use cam walker for support and crutches as needed however you can weight-bear as tolerated. ? ?While at home please rest, ice, elevate your ankle to help reduce inflammation/swelling.  You can take ibuprofen and Tylenol as needed for pain. ? ?Please follow-up with Dr. Jordan Likes sports medicine for further evaluation. ? ?Return to the ED for any new/worsening symptoms. ?

## 2021-10-23 NOTE — ED Provider Notes (Signed)
?[facility] EMERGENCY DEPARTMENT ?Provider Note ? ? ?CSN: [id number] ?Arrival date & time: 10/23/21  1659 ? ?  ? ?History ? ?Chief Complaint  ?Patient presents with  ? Ankle Pain  ? ? ?Cynthia Wolfe is a 26 y.o. female who presents to the ED today with complaint of sudden onset, constant, swelling/pain, to the right ankle that began earlier today around 4 PM.  Patient states that she was playing Frisbee later today when she inverted her ankle and fell.  Denies head injury or loss of consciousness.  She does report that she felt a pop in the left ankle upon falling.  He has been unable to bear much weight since that time.  She did not take anything for pain prior to arrival.  No other complaints. ? ?The history is provided by the patient and medical records.  ? ?  ? ?Home Medications ?Prior to Admission medications   ?Not on File  ?   ? ?Allergies    ?Gluten meal   ? ?Review of Systems   ?Review of Systems  ?Musculoskeletal:  Positive for arthralgias and joint swelling.  ?Skin:  Negative for wound.  ?Neurological:  Negative for syncope and headaches.  ?All other systems reviewed and are negative. ? ?Physical Exam ?Updated Vital Signs ?BP 130/90 (BP Location: Left Arm)   Pulse 91   Temp 98.7 ?F (37.1 ?C) (Oral)   Resp 17   Ht 5' 6.5" (1.689 m)   Wt 97.5 kg   LMP 10/09/2021   SpO2 100%   BMI 34.18 kg/m?  ?Physical Exam ?Vitals and nursing note reviewed.  ?Constitutional:   ?   Appearance: She is not ill-appearing.  ?HENT:  ?   Head: Normocephalic and atraumatic.  ?Eyes:  ?   Conjunctiva/sclera: Conjunctivae normal.  ?Cardiovascular:  ?   Rate and Rhythm: Normal rate and regular rhythm.  ?Pulmonary:  ?   Effort: Pulmonary effort is normal.  ?   Breath sounds: Normal breath sounds.  ?Musculoskeletal:  ?   Comments: Moderate swelling noted to the lateral aspect of the right ankle with associated tenderness palpation to same.  Limited range of motion secondary to pain.  Able to wiggle toes without  difficulty.  Cap refill less than 2 seconds to all toes.  2+ DP pulse.  No wounds.  ?Skin: ?   General: Skin is warm and dry.  ?   Coloration: Skin is not jaundiced.  ?Neurological:  ?   Mental Status: She is alert.  ? ? ?ED Results / Procedures / Treatments   ?Labs ?(all labs ordered are listed, but only abnormal results are displayed) ?Labs Reviewed - No data to display ? ?EKG ?None ? ?Radiology ?DG Ankle Complete Right ? ?Result Date: 10/23/2021 ?CLINICAL DATA:  Right ankle injury today.  Swelling and pain. EXAM: RIGHT ANKLE - COMPLETE 3+ VIEW COMPARISON:  Right ankle radiographs 07/14/2009. FINDINGS: There is moderate lateral malleolar soft tissue swelling, new from prior. The ankle mortise is symmetric and intact. There is a 3 mm ossicle that is minimally distally displaced from the distal tip of the fibula suggesting an avulsion injury at the talofibular ligaments insertion. The joint spaces are maintained. No dislocation. Unchanged oval sclerotic focus, likely benign bone island within the lateral distal tibial diaphysis. IMPRESSION: There is new moderate lateral malleolar soft tissue swelling. There is a small bone fragment at the distal aspect of the fibula that appears acute, however there was an even smaller bone fragment in the same  region on prior remote 07/14/2009 radiographs. This may represent an old or repeat avulsion fracture of the distal fibula or adjacent lateral talus. Electronically Signed   By: Neita Garnet M.D.   On: 10/23/2021 17:47   ? ?Procedures ?Procedures  ? ? ?Medications Ordered in ED ?Medications - No data to display ? ?ED Course/ Medical Decision Making/ A&P ?  ?                        ?Medical Decision Making ?26 year old female who presents to the ED today status post inversion injury to the right ankle.  Unable to bear weight since that time.  On arrival to the ED vitals are stable.  Patient had an x-ray done prior to being seen which does show a new moderate lateral malleolus  tissue swelling.  There is a small bony fragment at the distal aspect of the fibula that the radiologist believes appears acute however based off comparison x-ray in 2010 patient has a very small similar bony fragment in this area.  Radiologist reports that this may represent an old or repeat avulsion fracture.  Patient states that he has injured her ankle 2 times in the past.  She believes that the x-ray in 2010 was from previous visit.  On exam she has moderate swelling answers palpation along the lateral malleolus.  She is neurovascular intact throughout.  Left avulsion injury is new at this time.  Will provide cam walker for support.  Patient has crutches at bedside with her.  She reports that her pain is bearable at this time and does not require anything for pain.  She states that she feels comfortable taking ibuprofen and Tylenol at home.  Feel this is reasonable.  RICE therapy has been discussed with patient.  She will be given information for sports medicine follow-up.  She is in agreement plan and stable for discharge.  ? ?Problems Addressed: ?Closed avulsion fracture of right ankle, initial encounter: acute illness or injury ? ?Amount and/or Complexity of Data Reviewed ?Radiology: ordered. Decision-making details documented in ED Course. ? ? ? ? ? ? ? ? ? ? ?Final Clinical Impression(s) / ED Diagnoses ?Final diagnoses:  ?Closed avulsion fracture of right ankle, initial encounter  ? ? ?Rx / DC Orders ?ED Discharge Orders   ? ? None  ? ?  ? ? ? ?Discharge Instructions   ? ?  ?Please use cam walker for support and crutches as needed however you can weight-bear as tolerated. ? ?While at home please rest, ice, elevate your ankle to help reduce inflammation/swelling.  You can take ibuprofen and Tylenol as needed for pain. ? ?Please follow-up with Dr. Jordan Likes sports medicine for further evaluation. ? ?Return to the ED for any new/worsening symptoms. ? ? ? ? ?  ?Tanda Rockers, PA-C ?10/23/21 2103 ? ?   ?Derwood Kaplan, MD ?10/24/21 1541 ? ?

## 2022-04-24 ENCOUNTER — Telehealth: Payer: Self-pay | Admitting: Family Medicine

## 2022-04-24 NOTE — Telephone Encounter (Signed)
Pt called stating they would like to re-establish care with Dr. Charlett Blake. Pt was advised that Dr. Charlett Blake is not taking new patients at the moment and that a note would have to be sent back to get her approval. Pt acknowledged understanding.  Is care re-establishment ok for this pt?

## 2022-04-27 NOTE — Telephone Encounter (Signed)
Patient's mother notified that Dr. Charlett Blake cannot accept new patients. Offered to schedule her with another provider in the office but she did not want to wait until November.

## 2022-05-09 ENCOUNTER — Ambulatory Visit (INDEPENDENT_AMBULATORY_CARE_PROVIDER_SITE_OTHER): Payer: 59 | Admitting: Family Medicine

## 2022-05-09 VITALS — BP 129/95 | HR 98 | Wt 208.0 lb

## 2022-05-09 DIAGNOSIS — F1010 Alcohol abuse, uncomplicated: Secondary | ICD-10-CM

## 2022-05-09 DIAGNOSIS — R0989 Other specified symptoms and signs involving the circulatory and respiratory systems: Secondary | ICD-10-CM

## 2022-05-09 DIAGNOSIS — R109 Unspecified abdominal pain: Secondary | ICD-10-CM | POA: Diagnosis not present

## 2022-05-09 LAB — POCT URINALYSIS DIP (MANUAL ENTRY)
Bilirubin, UA: NEGATIVE
Glucose, UA: NEGATIVE mg/dL
Ketones, POC UA: NEGATIVE mg/dL
Leukocytes, UA: NEGATIVE
Nitrite, UA: NEGATIVE
Protein Ur, POC: NEGATIVE mg/dL
Spec Grav, UA: 1.01 (ref 1.010–1.025)
Urobilinogen, UA: 0.2 E.U./dL
pH, UA: 5.5 (ref 5.0–8.0)

## 2022-05-09 LAB — POCT UA - MICROSCOPIC ONLY: WBC, Ur, HPF, POC: NONE SEEN (ref 0–5)

## 2022-05-09 NOTE — Progress Notes (Unsigned)
    SUBJECTIVE:   CHIEF COMPLAINT / HPI:   Patient presents to discuss pain under arm. Past 4 months has been having L arm pain. Feels like she had a swollen lymph node the size of a dice. Feels like she irritated it but it went away and feels it will return  Pain in urethra and feels it will shoot up to kidney. Still has twinging pain on right side. Does not burn when she urinates. No frequency. Does not worsen during her cycles. Currently no abdominal pain. Denies vaginal symptoms. Endorses sharp pain about once a month but not always centered around period. Periods normal not heavy and occur every month   R ear keeps over producing wax and is very itchy  Yesterday had pain when bending over on right side where kidney will be.   PERTINENT  PMH / PSH: ***  OBJECTIVE:   BP (!) 129/95   Pulse 98   Wt 208 lb (94.3 kg)   LMP 05/03/2022   SpO2 98%   BMI 33.07 kg/m   ***  ASSESSMENT/PLAN:   No problem-specific Assessment & Plan notes found for this encounter.     [place]

## 2022-05-09 NOTE — Patient Instructions (Signed)
It was great seeing you today!  Today we are checking your blood work given the concern for the painful lymph node on your arm.  So this would be checking a white blood cell count, HIV, syphilis.  We will also check your liver kidney function I will call you if anything is abnormal or we will send you a letter if normal.  Please check-out at the front desk before leaving the clinic. I'd like to see you back in next couple of weeks for your Pap smear and follow-up, but if you need to be seen earlier than that for any new issues we're happy to fit you in, just give [place] a call!  Feel free to call with any questions or concerns at any time, at [phone].   Take care,  Dr. Shary [facility]

## 2022-05-10 LAB — CBC WITH DIFFERENTIAL/PLATELET
Basophils Absolute: 0 10*3/uL (ref 0.0–0.2)
Basos: 0 %
EOS (ABSOLUTE): 0 10*3/uL (ref 0.0–0.4)
Eos: 0 %
Hematocrit: 46.9 % — ABNORMAL HIGH (ref 34.0–46.6)
Hemoglobin: 15.1 g/dL (ref 11.1–15.9)
Immature Grans (Abs): 0 10*3/uL (ref 0.0–0.1)
Immature Granulocytes: 0 %
Lymphocytes Absolute: 2.1 10*3/uL (ref 0.7–3.1)
Lymphs: 33 %
MCH: 27 pg (ref 26.6–33.0)
MCHC: 32.2 g/dL (ref 31.5–35.7)
MCV: 84 fL (ref 79–97)
Monocytes Absolute: 0.6 10*3/uL (ref 0.1–0.9)
Monocytes: 9 %
Neutrophils Absolute: 3.6 10*3/uL (ref 1.4–7.0)
Neutrophils: 58 %
Platelets: 271 10*3/uL (ref 150–450)
RBC: 5.6 x10E6/uL — ABNORMAL HIGH (ref 3.77–5.28)
RDW: 12.8 % (ref 11.7–15.4)
WBC: 6.3 10*3/uL (ref 3.4–10.8)

## 2022-05-10 LAB — COMPREHENSIVE METABOLIC PANEL
ALT: 37 IU/L — ABNORMAL HIGH (ref 0–32)
AST: 26 IU/L (ref 0–40)
Albumin/Globulin Ratio: 1.8 (ref 1.2–2.2)
Albumin: 5.1 g/dL — ABNORMAL HIGH (ref 4.0–5.0)
Alkaline Phosphatase: 101 IU/L (ref 44–121)
BUN/Creatinine Ratio: 8 — ABNORMAL LOW (ref 9–23)
BUN: 7 mg/dL (ref 6–20)
Bilirubin Total: 0.6 mg/dL (ref 0.0–1.2)
CO2: 23 mmol/L (ref 20–29)
Calcium: 10.3 mg/dL — ABNORMAL HIGH (ref 8.7–10.2)
Chloride: 98 mmol/L (ref 96–106)
Creatinine, Ser: 0.9 mg/dL (ref 0.57–1.00)
Globulin, Total: 2.8 g/dL (ref 1.5–4.5)
Glucose: 79 mg/dL (ref 70–99)
Potassium: 4.2 mmol/L (ref 3.5–5.2)
Sodium: 137 mmol/L (ref 134–144)
Total Protein: 7.9 g/dL (ref 6.0–8.5)
eGFR: 91 mL/min/{1.73_m2} (ref >59)

## 2022-05-10 LAB — RPR: RPR Ser Ql: NONREACTIVE

## 2022-05-10 LAB — HIV ANTIBODY (ROUTINE TESTING W REFLEX): HIV Screen 4th Generation wRfx: NONREACTIVE

## 2022-05-11 ENCOUNTER — Encounter: Payer: Self-pay | Admitting: Family Medicine

## 2022-05-11 DIAGNOSIS — Z87898 Personal history of other specified conditions: Secondary | ICD-10-CM | POA: Insufficient documentation

## 2022-05-11 DIAGNOSIS — R0989 Other specified symptoms and signs involving the circulatory and respiratory systems: Secondary | ICD-10-CM | POA: Insufficient documentation

## 2022-05-11 NOTE — Assessment & Plan Note (Signed)
History of intermittent sharp right sided flank pain occurring once a month for the past 8 months. No current dysuria, hematuria or frequency but will check UA for infection, as well as check CBC and CMP given frequent alcohol use. Potentially UTI vs pelvic etiology. Patient to return for pap smear and can evaluate for STIs at that time as well. Return precautions discussed.

## 2022-05-12 ENCOUNTER — Ambulatory Visit: Payer: 59 | Admitting: Family Medicine

## 2022-05-26 ENCOUNTER — Ambulatory Visit: Payer: 59 | Admitting: Family Medicine

## 2022-06-05 ENCOUNTER — Ambulatory Visit: Payer: 59 | Admitting: Family Medicine

## 2022-08-08 ENCOUNTER — Other Ambulatory Visit: Payer: Self-pay

## 2022-08-08 ENCOUNTER — Encounter (HOSPITAL_BASED_OUTPATIENT_CLINIC_OR_DEPARTMENT_OTHER): Payer: Self-pay

## 2022-08-08 ENCOUNTER — Emergency Department (HOSPITAL_BASED_OUTPATIENT_CLINIC_OR_DEPARTMENT_OTHER): Payer: 59

## 2022-08-08 ENCOUNTER — Emergency Department (HOSPITAL_BASED_OUTPATIENT_CLINIC_OR_DEPARTMENT_OTHER)
Admission: EM | Admit: 2022-08-08 | Discharge: 2022-08-08 | Disposition: A | Discharge status: Home / Self Care | Payer: 59 | Attending: Emergency Medicine | Admitting: Emergency Medicine

## 2022-08-08 DIAGNOSIS — R0600 Dyspnea, unspecified: Secondary | ICD-10-CM | POA: Diagnosis not present

## 2022-08-08 DIAGNOSIS — R002 Palpitations: Secondary | ICD-10-CM | POA: Diagnosis present

## 2022-08-08 LAB — CBC
HCT: 40.8 % (ref 36.0–46.0)
Hemoglobin: 13.9 g/dL (ref 12.0–15.0)
MCH: 28 pg (ref 26.0–34.0)
MCHC: 34.1 g/dL (ref 30.0–36.0)
MCV: 82.3 fL (ref 80.0–100.0)
Platelets: 225 10*3/uL (ref 150–400)
RBC: 4.96 MIL/uL (ref 3.87–5.11)
RDW: 14.1 % (ref 11.5–15.5)
WBC: 6.8 10*3/uL (ref 4.0–10.5)
nRBC: 0 % (ref 0.0–0.2)

## 2022-08-08 LAB — BASIC METABOLIC PANEL
Anion gap: 10 (ref 5–15)
BUN: 7 mg/dL (ref 6–20)
CO2: 24 mmol/L (ref 22–32)
Calcium: 9.5 mg/dL (ref 8.9–10.3)
Chloride: 100 mmol/L (ref 98–111)
Creatinine, Ser: 0.79 mg/dL (ref 0.44–1.00)
GFR, Estimated: >60 mL/min (ref >60)
Glucose, Bld: 126 mg/dL — ABNORMAL HIGH (ref 70–99)
Potassium: 3.6 mmol/L (ref 3.5–5.1)
Sodium: 134 mmol/L — ABNORMAL LOW (ref 135–145)

## 2022-08-08 LAB — D-DIMER, QUANTITATIVE: D-Dimer, Quant: <0.27 ug/mL-FEU (ref 0.00–0.50)

## 2022-08-08 LAB — PREGNANCY, URINE: Preg Test, Ur: NEGATIVE

## 2022-08-08 LAB — TROPONIN I (HIGH SENSITIVITY): Troponin I (High Sensitivity): 3 ng/L (ref <18)

## 2022-08-08 MED ORDER — SODIUM CHLORIDE 0.9 % IV BOLUS
1000.0000 mL | Freq: Once | INTRAVENOUS | Status: AC
  Administered 2022-08-08: 1000 mL via INTRAVENOUS

## 2022-08-08 NOTE — ED Triage Notes (Signed)
Since 10pm c/o heart palpitations with dyspnea on exertion. States normally drinks 3 cups coffee a day. Denies chest pain.

## 2022-08-08 NOTE — ED Provider Notes (Signed)
Emergency Department Provider Note   I have reviewed the triage vital signs and the nursing notes.   HISTORY  Chief Complaint Palpitations   HPI Cynthia Wolfe is a 27 y.o. female with past history the past history reviewed below presents to the emergency department with more persistent heart palpitations.  She states she occasionally will have palpitations over the past several months but not very frequently.  She denies any chest pain or shortness of breath.  At 10 PM yesterday she developed much more frequent heart palpitations with some mild dyspnea on exertion.  No fevers or chills.  She does drink couple cups of coffee every morning and has recently started drinking caffeinated tea in the evening.  No other changes to medications.  She has not followed with a cardiologist for these in the past.  No active symptoms. No syncope.   Past Medical History:  Diagnosis Date   Abdominal pain    ADHD (attention deficit hyperactivity disorder)    Hx: of ADD   Allergy    Hx: of seasonal   Anxiety and depression 05/17/2013   Constipation    Gluten intolerance    Headache(784.0)    Lesion of breast 12/10/2016   Overweight 05/17/2013   Preventative health care 05/17/2013   Reflux    RUQ pain 12/08/2016   Unspecified sinusitis (chronic) 05/17/2013   Vertigo 12/08/2016   [place] (well child check) 05/17/2013    Review of Systems  Constitutional: No fever/chills Eyes: No visual changes. Cardiovascular: Denies chest pain. Positive palpitations.  Respiratory: Positive shortness of breath on exertion.  Gastrointestinal: No abdominal pain.  No nausea, no vomiting.  No diarrhea.  No constipation. Genitourinary: Negative for dysuria. Musculoskeletal: Negative for back pain. Skin: Negative for rash. Neurological: Negative for headaches, focal weakness or numbness.   ____________________________________________   PHYSICAL EXAM:  VITAL SIGNS: ED Triage Vitals  Enc Vitals Group     BP  08/08/22 1046 128/86     Pulse Rate 08/08/22 1046 87     Resp 08/08/22 1046 18     Temp 08/08/22 1046 97.8 F (36.6 C)     Temp Source 08/08/22 1046 Oral     SpO2 08/08/22 1046 97 %     Weight 08/08/22 1043 195 lb (88.5 kg)     Height 08/08/22 1043 5' 6.5" (1.689 m)   Constitutional: Alert and oriented. Well appearing and in no acute distress. Eyes: Conjunctivae are normal.  Head: Atraumatic. Nose: No congestion/rhinnorhea. Mouth/Throat: Mucous membranes are moist.  Neck: No stridor.   Cardiovascular: Normal rate, regular rhythm. Good peripheral circulation. Grossly normal heart sounds.   Respiratory: Normal respiratory effort.  No retractions. Lungs CTAB. Gastrointestinal: Soft and nontender. No distention.  Musculoskeletal: No lower extremity tenderness nor edema. No gross deformities of extremities. Neurologic:  Normal speech and language. No gross focal neurologic deficits are appreciated.  Skin:  Skin is warm, dry and intact. No rash noted.  ____________________________________________   LABS (all labs ordered are listed, but only abnormal results are displayed)  Labs Reviewed  BASIC METABOLIC PANEL - Abnormal; Notable for the following components:      Result Value   Sodium 134 (*)    Glucose, Bld 126 (*)    All other components within normal limits  CBC  PREGNANCY, URINE  D-DIMER, QUANTITATIVE  TROPONIN I (HIGH SENSITIVITY)   ____________________________________________  EKG   EKG Interpretation  Date/Time:  Tuesday August 08 2022 10:49:05 EST Ventricular Rate:  85 PR Interval:  133 QRS Duration: 78 QT Interval:  343 QTC Calculation: 408 R Axis:   37 Text Interpretation: Sinus rhythm Borderline ST depression, anterior leads Baseline wander in lead(s) V3 V4 Partial missing lead(s): V3 Confirmed by Ronnald Nian, Adam ([id number]) on 08/09/2022 12:36:09 PM        ____________________________________________  RADIOLOGY  DG Chest 2 View  Result Date:  08/08/2022 CLINICAL DATA:  Shortness of breath. Heart palpitations and dyspnea on exertion. EXAM: CHEST - 2 VIEW COMPARISON:  None Available. FINDINGS: Cardiac silhouette and mediastinal contours are within normal limits. The lungs are clear. No pleural effusion or pneumothorax. No acute skeletal abnormality. IMPRESSION: No active cardiopulmonary disease. Electronically Signed   By: Yvonne Kendall M.D.   On: 08/08/2022 11:25    ____________________________________________   PROCEDURES  Procedure(s) performed:   Procedures  None  ____________________________________________   INITIAL IMPRESSION / ASSESSMENT AND PLAN / ED COURSE  Pertinent labs & imaging results that were available during my care of the patient were reviewed by me and considered in my medical decision making (see chart for details).   This patient is Presenting for Evaluation of CP/palpitations, which does require a range of treatment options, and is a complaint that involves a high risk of morbidity and mortality.  The Differential Diagnoses includes but is not exclusive to acute coronary syndrome, aortic dissection, pulmonary embolism, cardiac tamponade, community-acquired pneumonia, pericarditis, musculoskeletal chest wall pain, etc.   Critical Interventions-    Medications  sodium chloride 0.9 % bolus 1,000 mL (0 mLs Intravenous Stopped 08/08/22 1312)    Reassessment after intervention: Symptoms improved.    I did obtain Additional Historical Information from mother at bedside.    Clinical Laboratory Tests Ordered, included troponin and D-dimer negative.  Pregnancy negative.  No anemia.  No acute electrolyte disturbance.  Radiologic Tests Ordered, included CXR. I independently interpreted the images and agree with radiology interpretation.   Cardiac Monitor Tracing which shows NSR. No ectopy.    Social Determinants of Health Risk Denies drug use.   Medical Decision Making: Summary:  Presents emergency  department with intermittent palpitations becoming more persistent since yesterday.  Sinus rhythm here without acute arrhythmia on telemetry or other ectopy.  Labs are reassuring.  Reevaluation with update and discussion with patient and mom at bedside.  Discussed labs and chest x-ray imaging.  Feeling improved after IV fluids.  We discussed cutting back on caffeine significantly as this may be driving symptoms.  I will place a referral in our system for cardiology for follow-up and consideration of ambulatory monitoring if symptoms continue.  Considered admission but workup reassuring. Stable for discharge.   Patient's presentation is most consistent with acute illness / injury with system symptoms.   Disposition: discharge  ____________________________________________  FINAL CLINICAL IMPRESSION(S) / ED DIAGNOSES  Final diagnoses:  Palpitations    Note:  This document was prepared using Dragon voice recognition software and may include unintentional dictation errors.  Nanda Quinton, MD, [facility]    Mylynn Dinh, [facility], MD 08/10/22 [phone]

## 2022-08-08 NOTE — Discharge Instructions (Signed)
You were seen in the emergency room today with heart palpitations.  Please follow-up with the cardiologist listed.  The office should be calling you for an appointment but if you do not hear from them in the next 24 hours please call to schedule a follow-up.  Please try and cut back your caffeine intake as this may be contributing.  Please try and stay hydrated.  Return with any new or suddenly worsening symptoms.

## 2022-08-25 ENCOUNTER — Telehealth: Payer: Self-pay | Admitting: Internal Medicine

## 2022-08-25 NOTE — Telephone Encounter (Addendum)
Open in error

## 2022-08-25 NOTE — Telephone Encounter (Signed)
Next messaage

## 2022-08-30 ENCOUNTER — Ambulatory Visit: Payer: 59 | Admitting: Cardiology

## 2023-01-21 IMAGING — DX DG ANKLE COMPLETE 3+V*R*
3 series · 3 of 3 positions shown · non-contrast
Comparison: Right ankle radiographs 07/14/2009.

CLINICAL DATA: Right ankle injury today.  Swelling and pain.

EXAM:
RIGHT ANKLE - COMPLETE 3+ VIEW

[ankle ap]
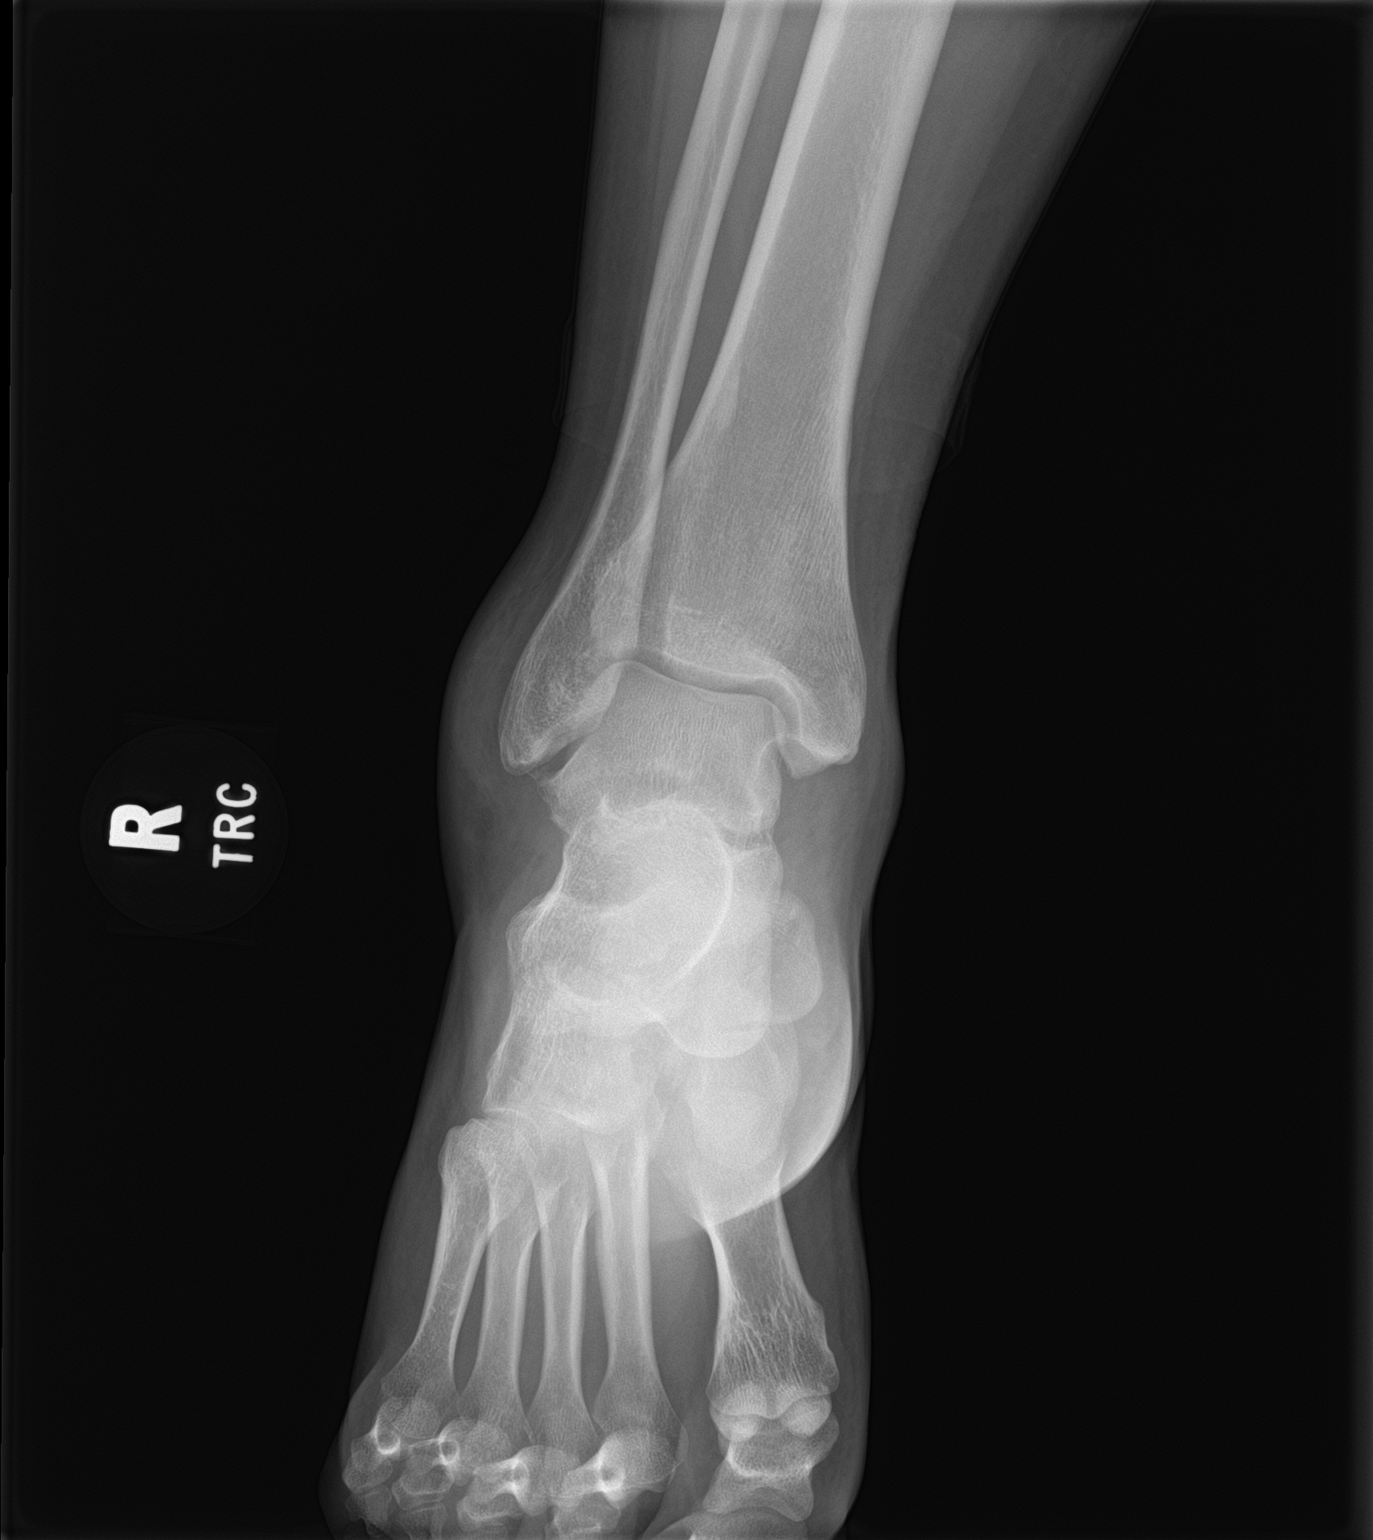

[ankle obl]
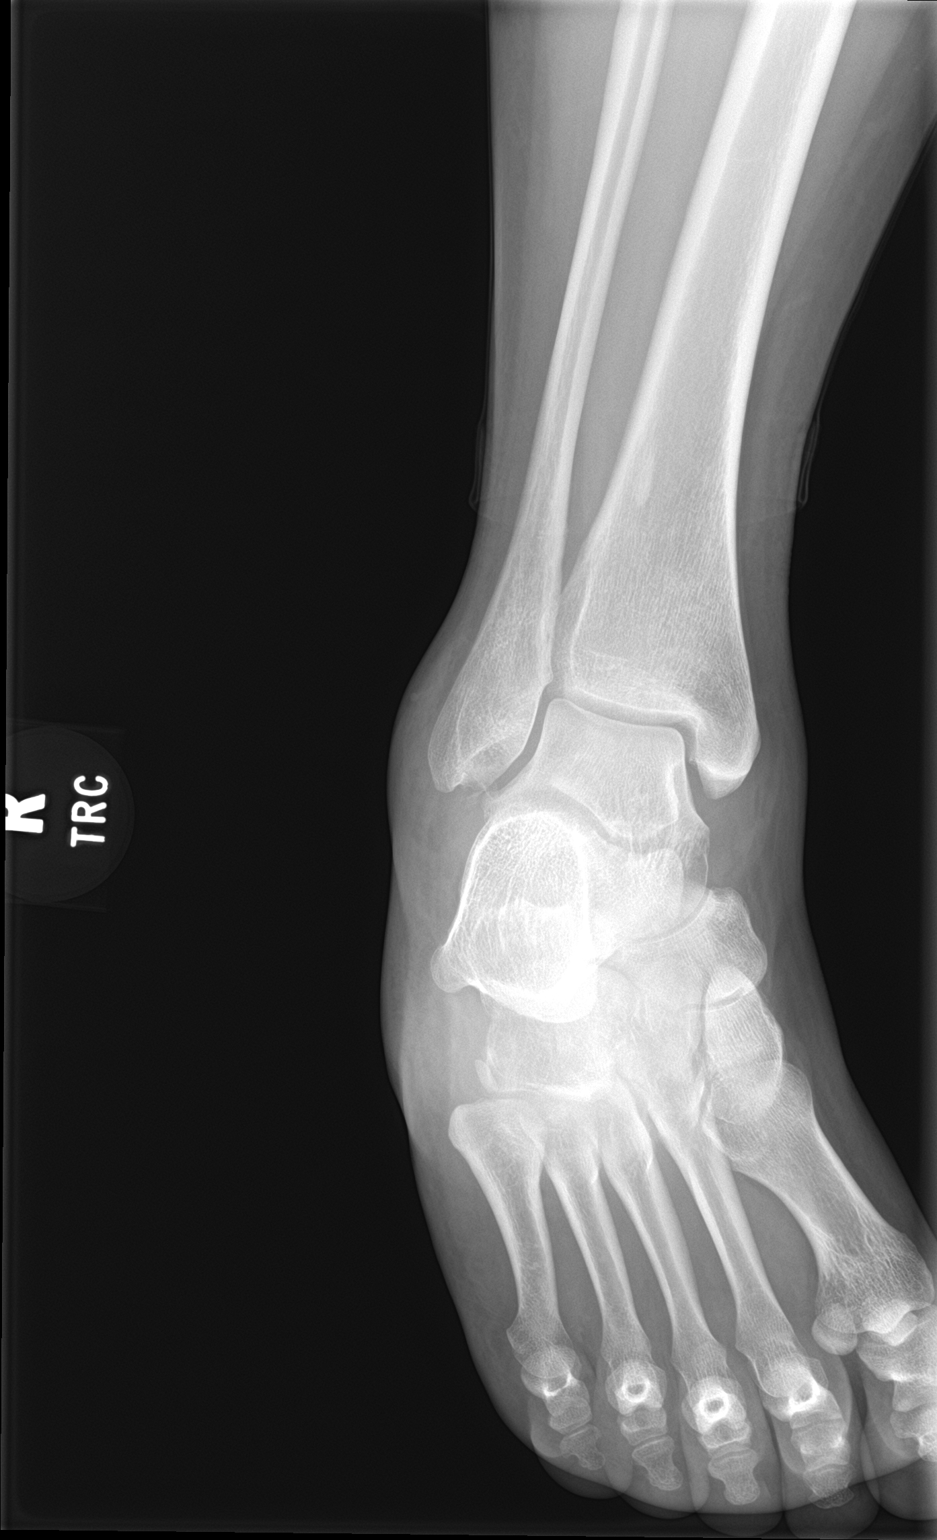

[ankle lat]
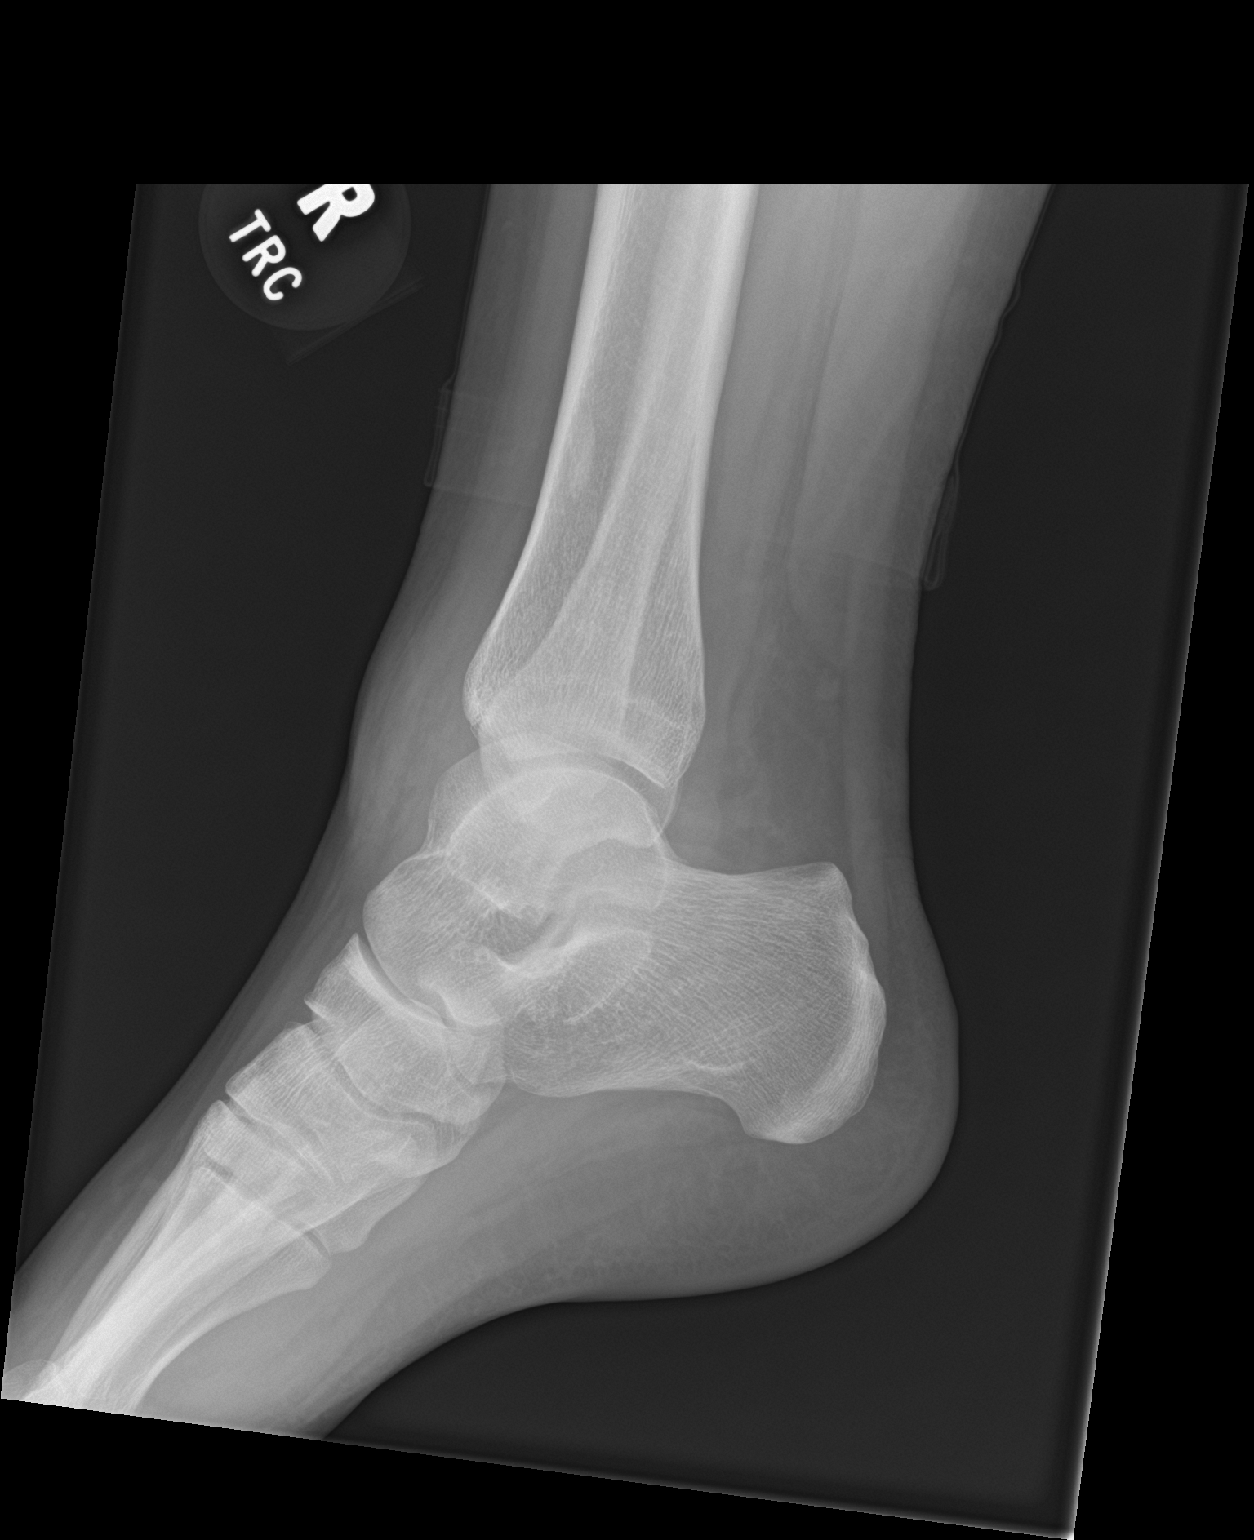

[3 of 3 positions shown; findings below may reference images not displayed]

FINDINGS: There is moderate lateral malleolar soft tissue swelling, new from
prior. The ankle mortise is symmetric and intact. There is a 3 mm
ossicle that is minimally distally displaced from the distal tip of
the fibula suggesting an avulsion injury at the talofibular
ligaments insertion. The joint spaces are maintained. No
dislocation. Unchanged oval sclerotic focus, likely benign bone
island within the lateral distal tibial diaphysis.
IMPRESSION: There is new moderate lateral malleolar soft tissue swelling. There
is a small bone fragment at the distal aspect of the fibula that
appears acute, however there was an even smaller bone fragment in
the same region on prior remote 07/14/2009 radiographs. This may
represent an old or repeat avulsion fracture of the distal fibula or
adjacent lateral talus.
